# Patient Record
Sex: Female | Born: 1935 | Race: White | Hispanic: No | Smoking: Never smoker
Health system: Southern US, Community
[De-identification: ages and names within clinical notes are randomized; demographics above are authoritative.]

---

## 1997-12-24 ENCOUNTER — Other Ambulatory Visit: Admission: RE | Admit: 1997-12-24 | Discharge: 1997-12-24 | Payer: Self-pay | Admitting: Geriatric Medicine

## 1999-07-29 ENCOUNTER — Encounter: Admission: RE | Admit: 1999-07-29 | Discharge: 1999-07-29 | Payer: Self-pay | Admitting: Geriatric Medicine

## 1999-07-29 ENCOUNTER — Encounter: Payer: Self-pay | Admitting: Geriatric Medicine

## 2000-08-17 ENCOUNTER — Encounter: Admission: RE | Admit: 2000-08-17 | Discharge: 2000-08-17 | Payer: Self-pay | Admitting: Geriatric Medicine

## 2000-08-17 ENCOUNTER — Encounter: Payer: Self-pay | Admitting: Geriatric Medicine

## 2001-03-15 ENCOUNTER — Other Ambulatory Visit: Admission: RE | Admit: 2001-03-15 | Discharge: 2001-03-15 | Payer: Self-pay | Admitting: Geriatric Medicine

## 2001-08-18 ENCOUNTER — Encounter: Payer: Self-pay | Admitting: Geriatric Medicine

## 2001-08-18 ENCOUNTER — Encounter: Admission: RE | Admit: 2001-08-18 | Discharge: 2001-08-18 | Payer: Self-pay | Admitting: Geriatric Medicine

## 2002-04-23 ENCOUNTER — Ambulatory Visit (HOSPITAL_COMMUNITY): Admission: RE | Admit: 2002-04-23 | Discharge: 2002-04-23 | Payer: Self-pay | Admitting: *Deleted

## 2002-08-29 ENCOUNTER — Encounter: Payer: Self-pay | Admitting: Geriatric Medicine

## 2002-08-29 ENCOUNTER — Encounter: Admission: RE | Admit: 2002-08-29 | Discharge: 2002-08-29 | Payer: Self-pay | Admitting: Geriatric Medicine

## 2003-03-29 ENCOUNTER — Encounter: Admission: RE | Admit: 2003-03-29 | Discharge: 2003-03-29 | Payer: Self-pay | Admitting: Geriatric Medicine

## 2003-09-04 ENCOUNTER — Encounter: Admission: RE | Admit: 2003-09-04 | Discharge: 2003-09-04 | Payer: Self-pay | Admitting: Geriatric Medicine

## 2004-04-02 ENCOUNTER — Encounter: Admission: RE | Admit: 2004-04-02 | Discharge: 2004-04-02 | Payer: Self-pay | Admitting: Geriatric Medicine

## 2004-09-08 ENCOUNTER — Encounter: Admission: RE | Admit: 2004-09-08 | Discharge: 2004-09-08 | Payer: Self-pay | Admitting: Geriatric Medicine

## 2005-11-08 ENCOUNTER — Encounter: Admission: RE | Admit: 2005-11-08 | Discharge: 2005-11-08 | Payer: Self-pay | Admitting: Geriatric Medicine

## 2006-10-14 ENCOUNTER — Encounter: Admission: RE | Admit: 2006-10-14 | Discharge: 2006-10-14 | Payer: Self-pay | Admitting: Geriatric Medicine

## 2006-12-26 ENCOUNTER — Encounter: Admission: RE | Admit: 2006-12-26 | Discharge: 2006-12-26 | Payer: Self-pay | Admitting: Geriatric Medicine

## 2008-05-01 ENCOUNTER — Encounter: Admission: RE | Admit: 2008-05-01 | Discharge: 2008-05-01 | Payer: Self-pay | Admitting: Geriatric Medicine

## 2008-05-03 ENCOUNTER — Encounter: Admission: RE | Admit: 2008-05-03 | Discharge: 2008-05-03 | Payer: Self-pay | Admitting: Geriatric Medicine

## 2008-05-30 ENCOUNTER — Encounter: Admission: RE | Admit: 2008-05-30 | Discharge: 2008-05-30 | Payer: Self-pay | Admitting: Geriatric Medicine

## 2008-09-17 IMAGING — CR DG ANKLE COMPLETE 3+V*R*
3 series · 3 of 3 positions shown · non-contrast
Comparison: none

CLINICAL DATA: 88 year old with right ankle pain, injured 12/15/06. 
 RIGHT ANKLE THREE VIEWS:

[view not recorded (1 of 3)]
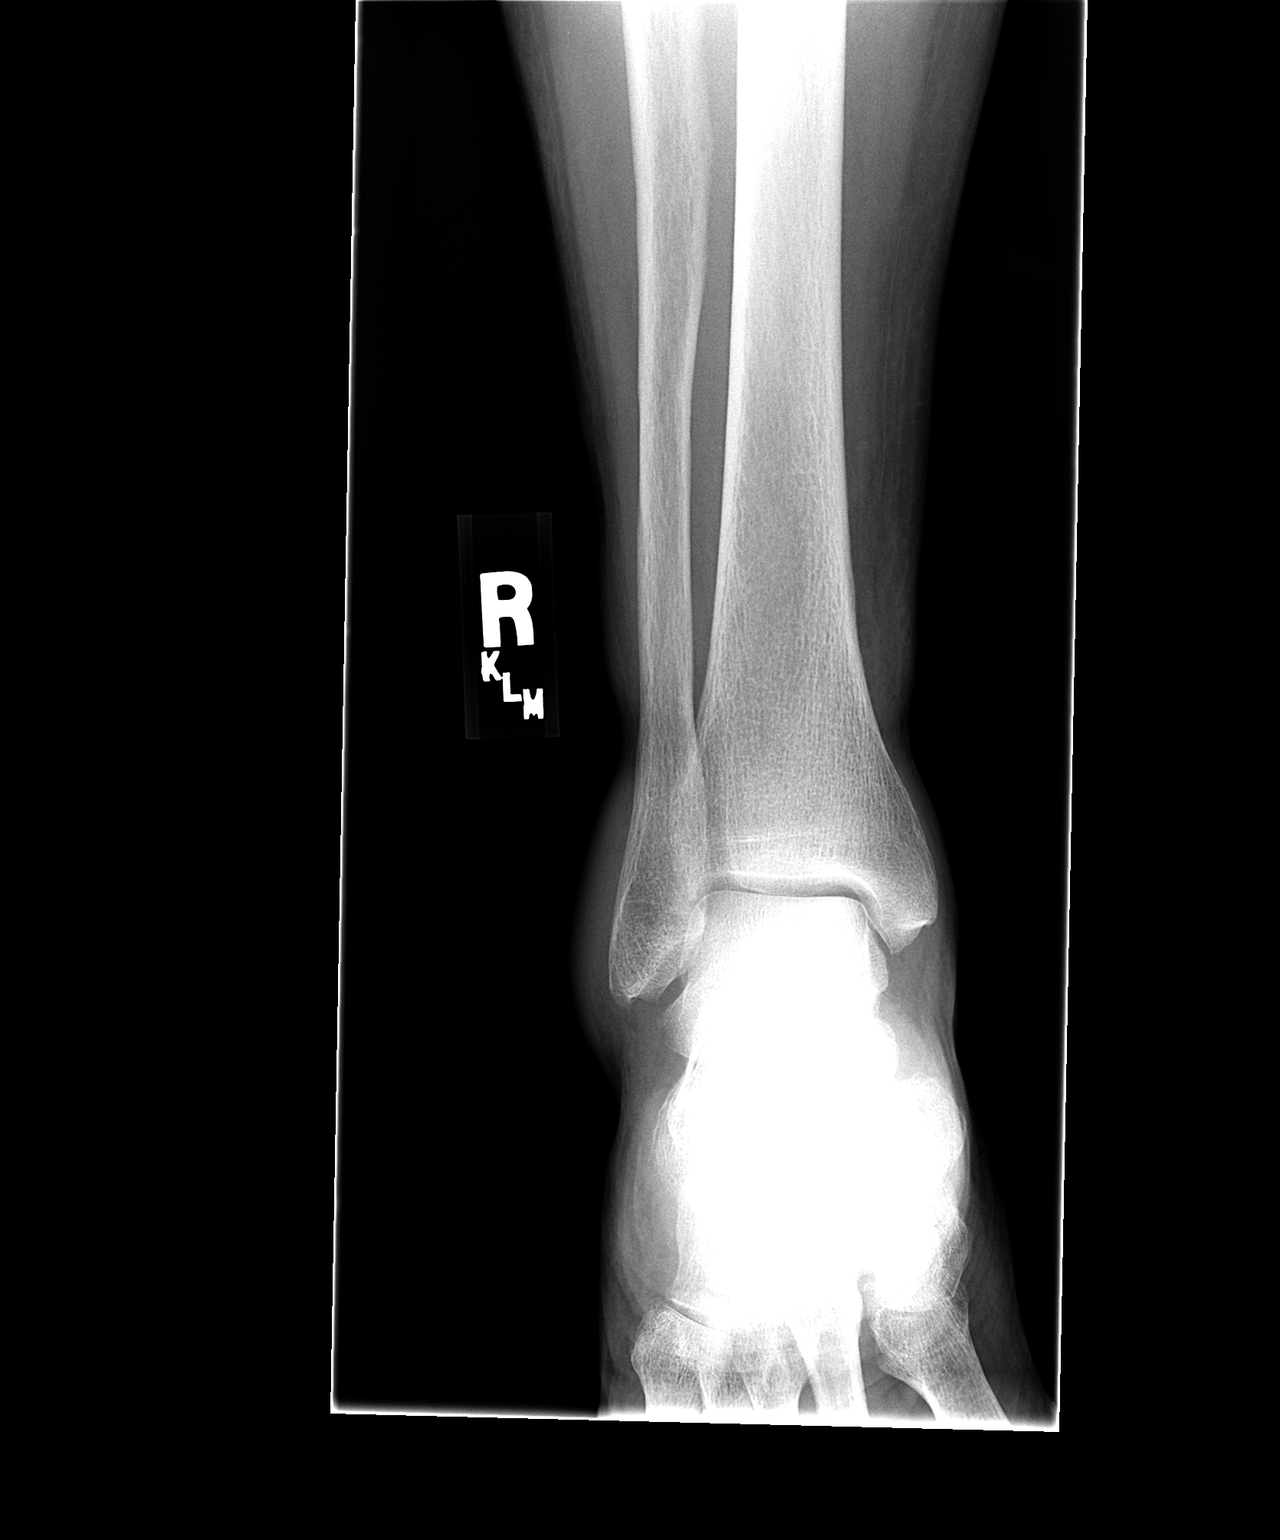

[view not recorded (2 of 3)]
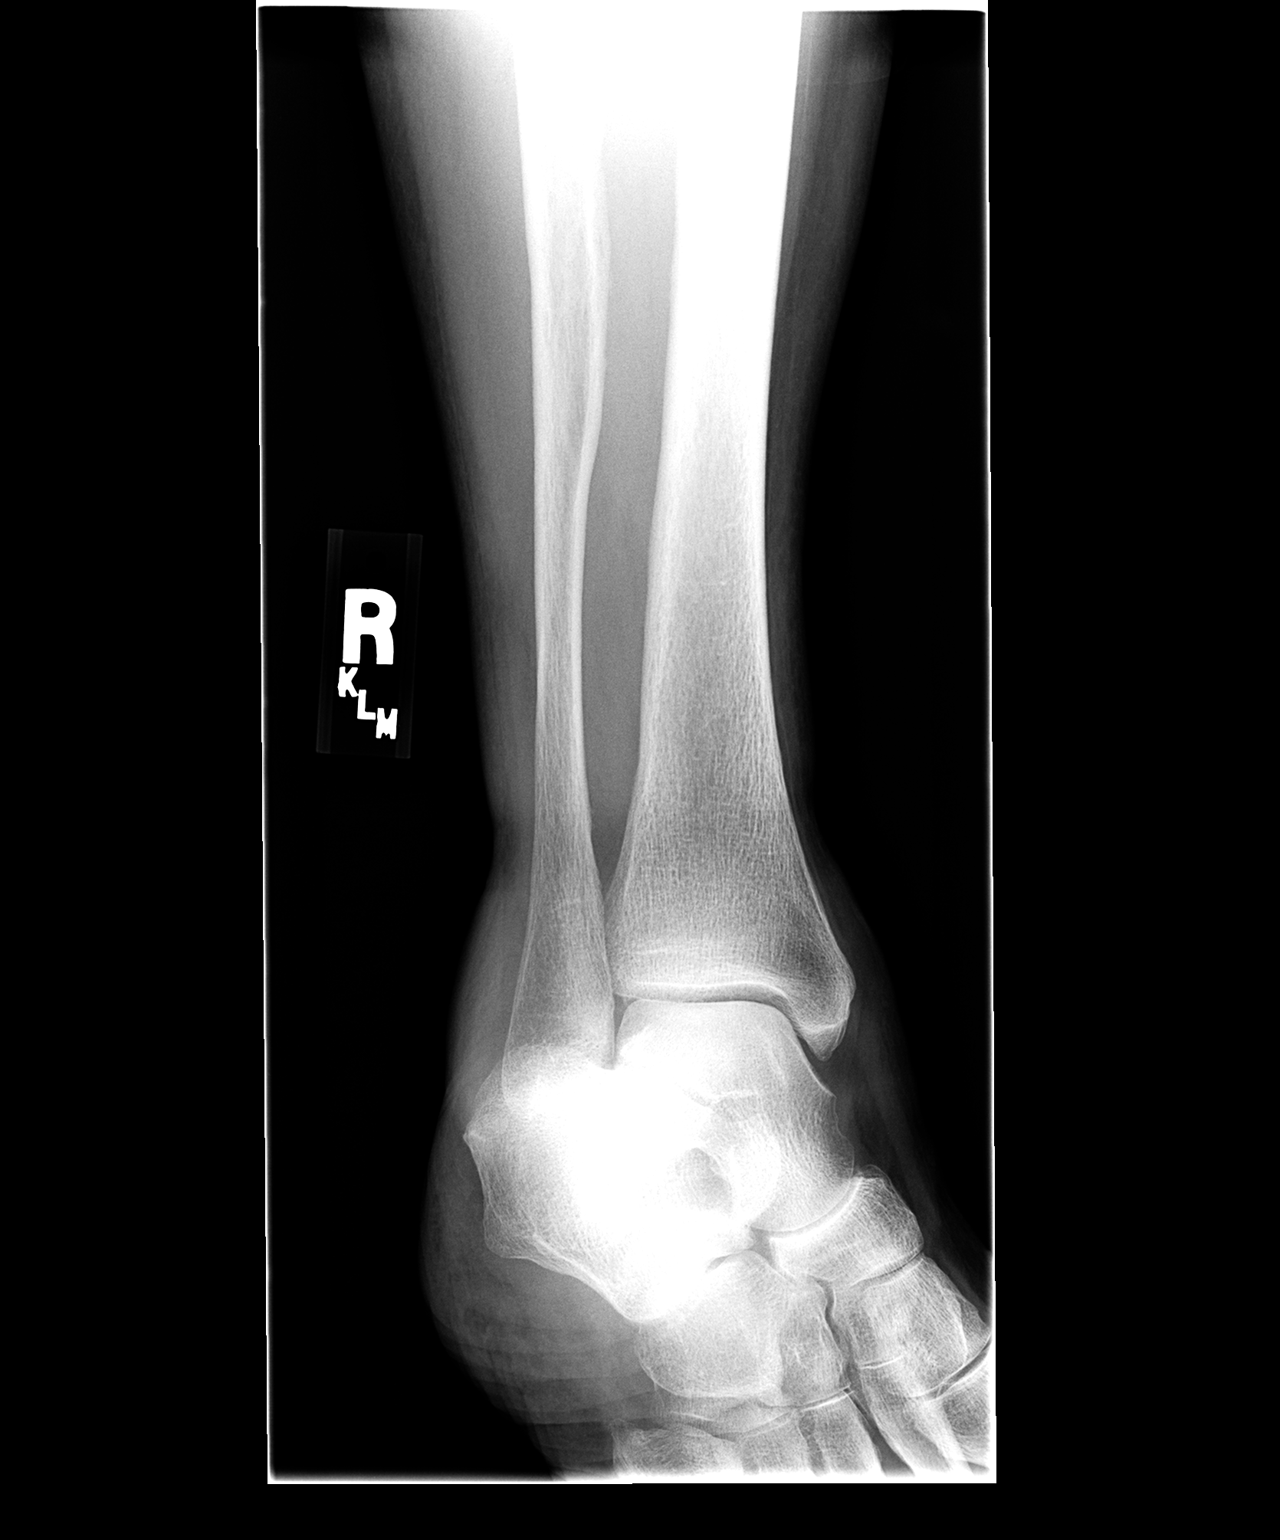

[view not recorded (3 of 3)]
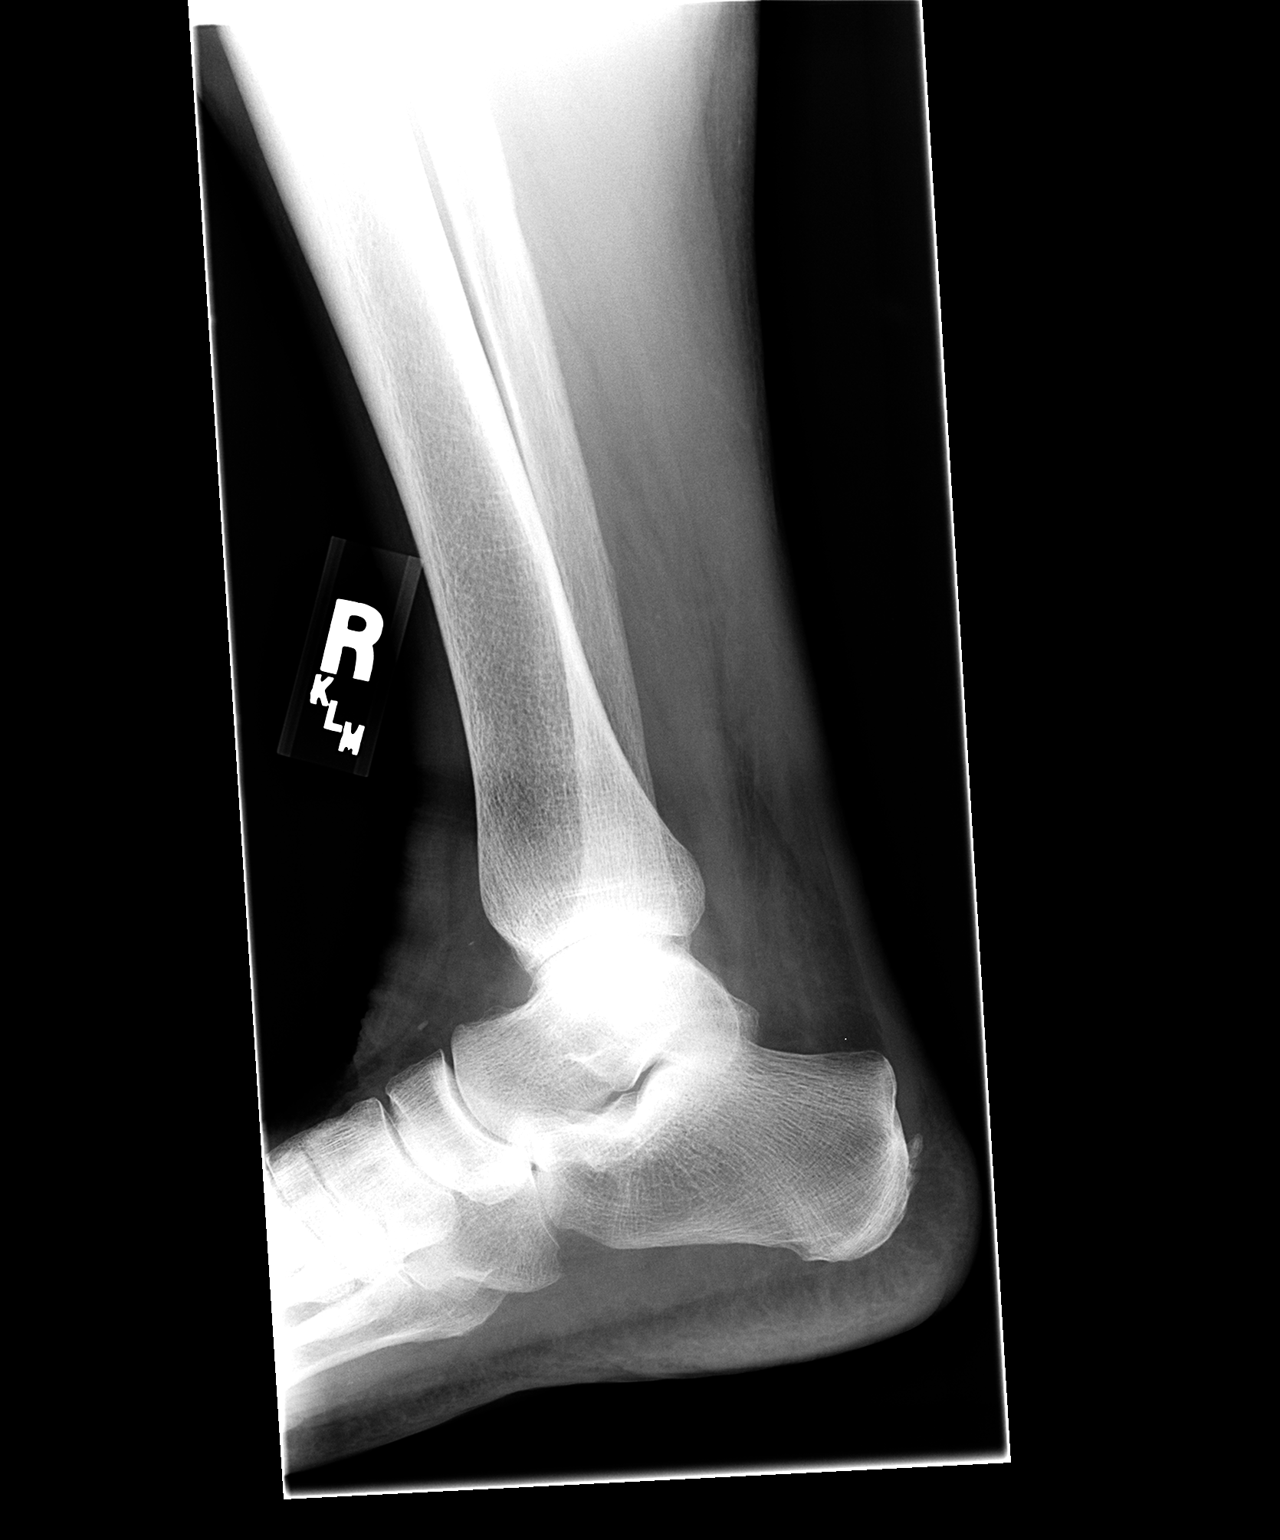

[3 of 3 positions shown; findings below may reference images not displayed]

FINDINGS: There is mild soft tissue swelling laterally.  The ankle mortis is maintained.  No fracture are seen.  No osteochondral lesions.
IMPRESSION: 1.  No acute bony findings or significant degenerative changes.

## 2008-12-27 ENCOUNTER — Encounter: Admission: RE | Admit: 2008-12-27 | Discharge: 2008-12-27 | Payer: Self-pay | Admitting: Urology

## 2009-06-06 ENCOUNTER — Encounter: Admission: RE | Admit: 2009-06-06 | Discharge: 2009-06-06 | Payer: Self-pay | Admitting: Geriatric Medicine

## 2010-01-22 IMAGING — CR DG CHEST 2V
2 series · 2 of 2 positions shown · non-contrast
Comparison: Chest x-ray of 10/14/2006

CLINICAL DATA: Exposed to tuberculosis as a child

CHEST - 2 VIEW

[view not recorded (1 of 2)]
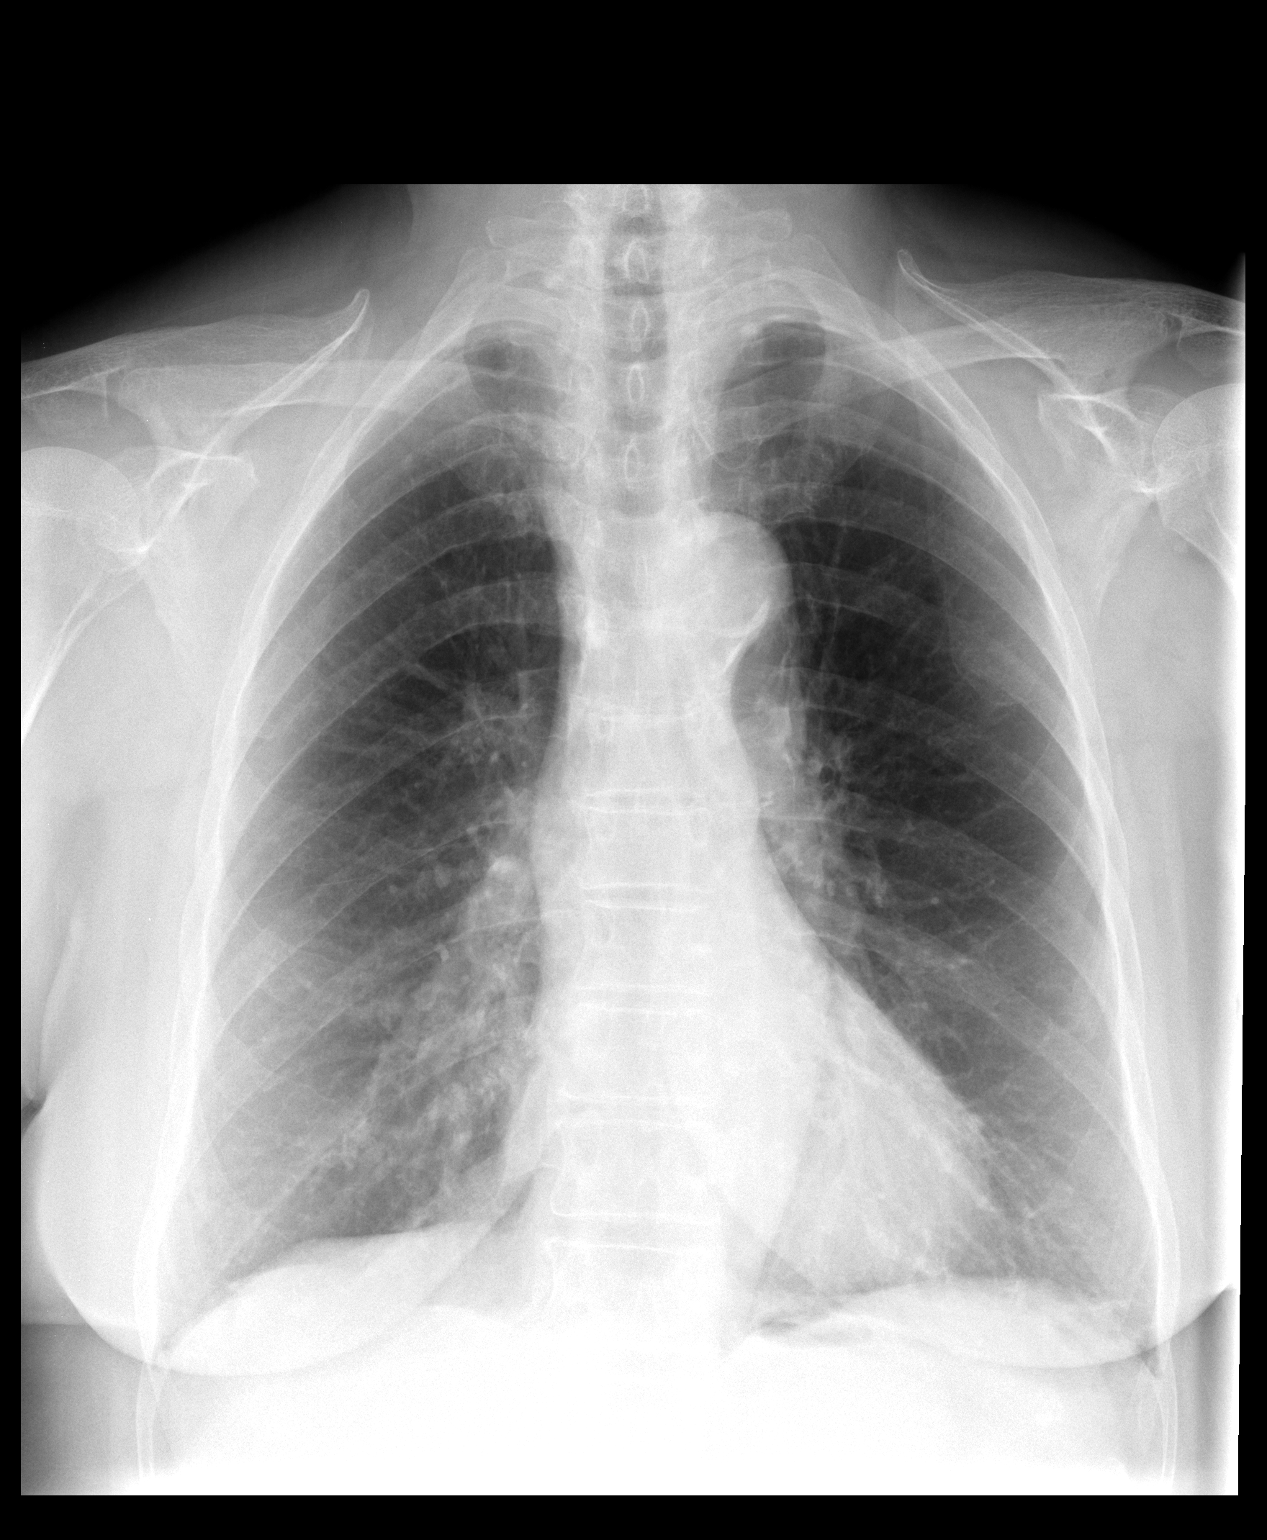

[view not recorded (2 of 2)]
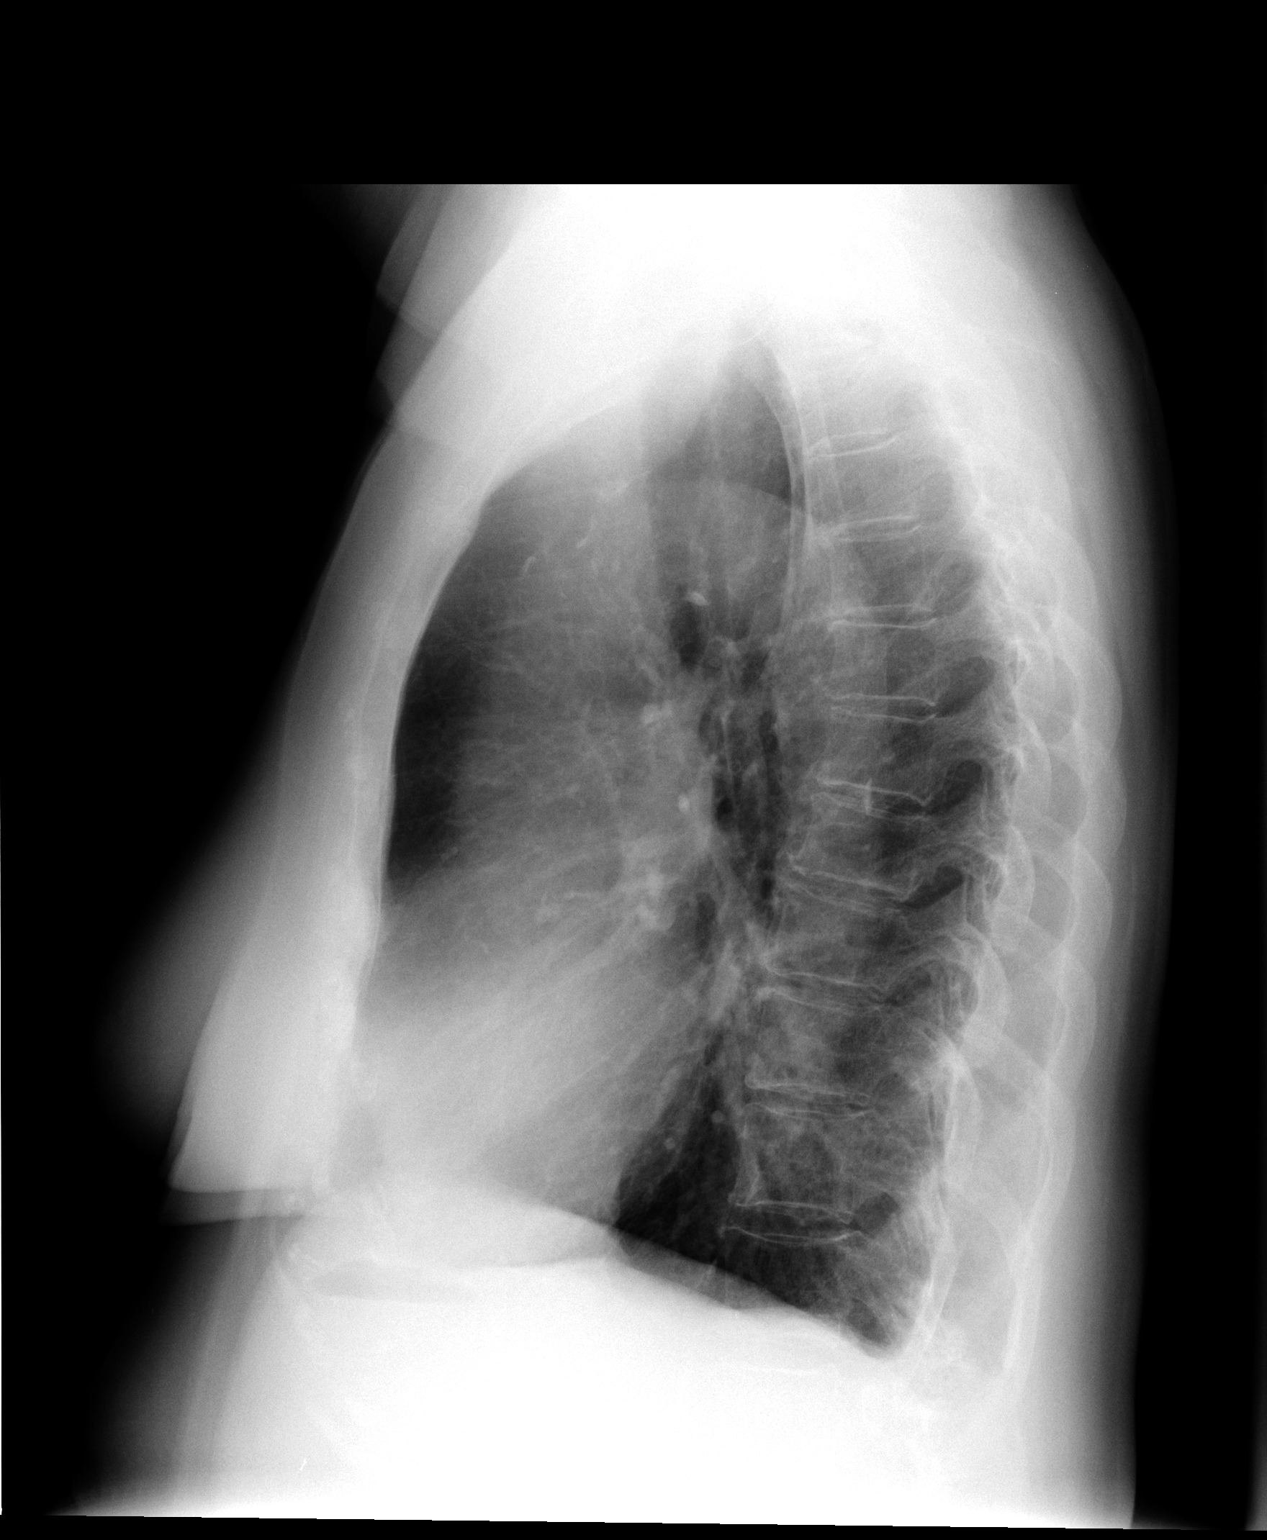

[2 of 2 positions shown; findings below may reference images not displayed]

FINDINGS: The lungs remain hyperaerated.  No active infiltrate or
effusion is seen.  The heart is within upper limits of normal.  No
acute bony abnormality is noted.
IMPRESSION: No active lung disease.  No change in hyperaeration.

## 2010-02-20 IMAGING — CR DG OS CALCIS 2+V*L*
2 series · 2 of 2 positions shown · non-contrast
Comparison: None

CLINICAL DATA: Heel pain.

LEFT OS CALCIS - 2+ VIEW

[view not recorded (1 of 2)]
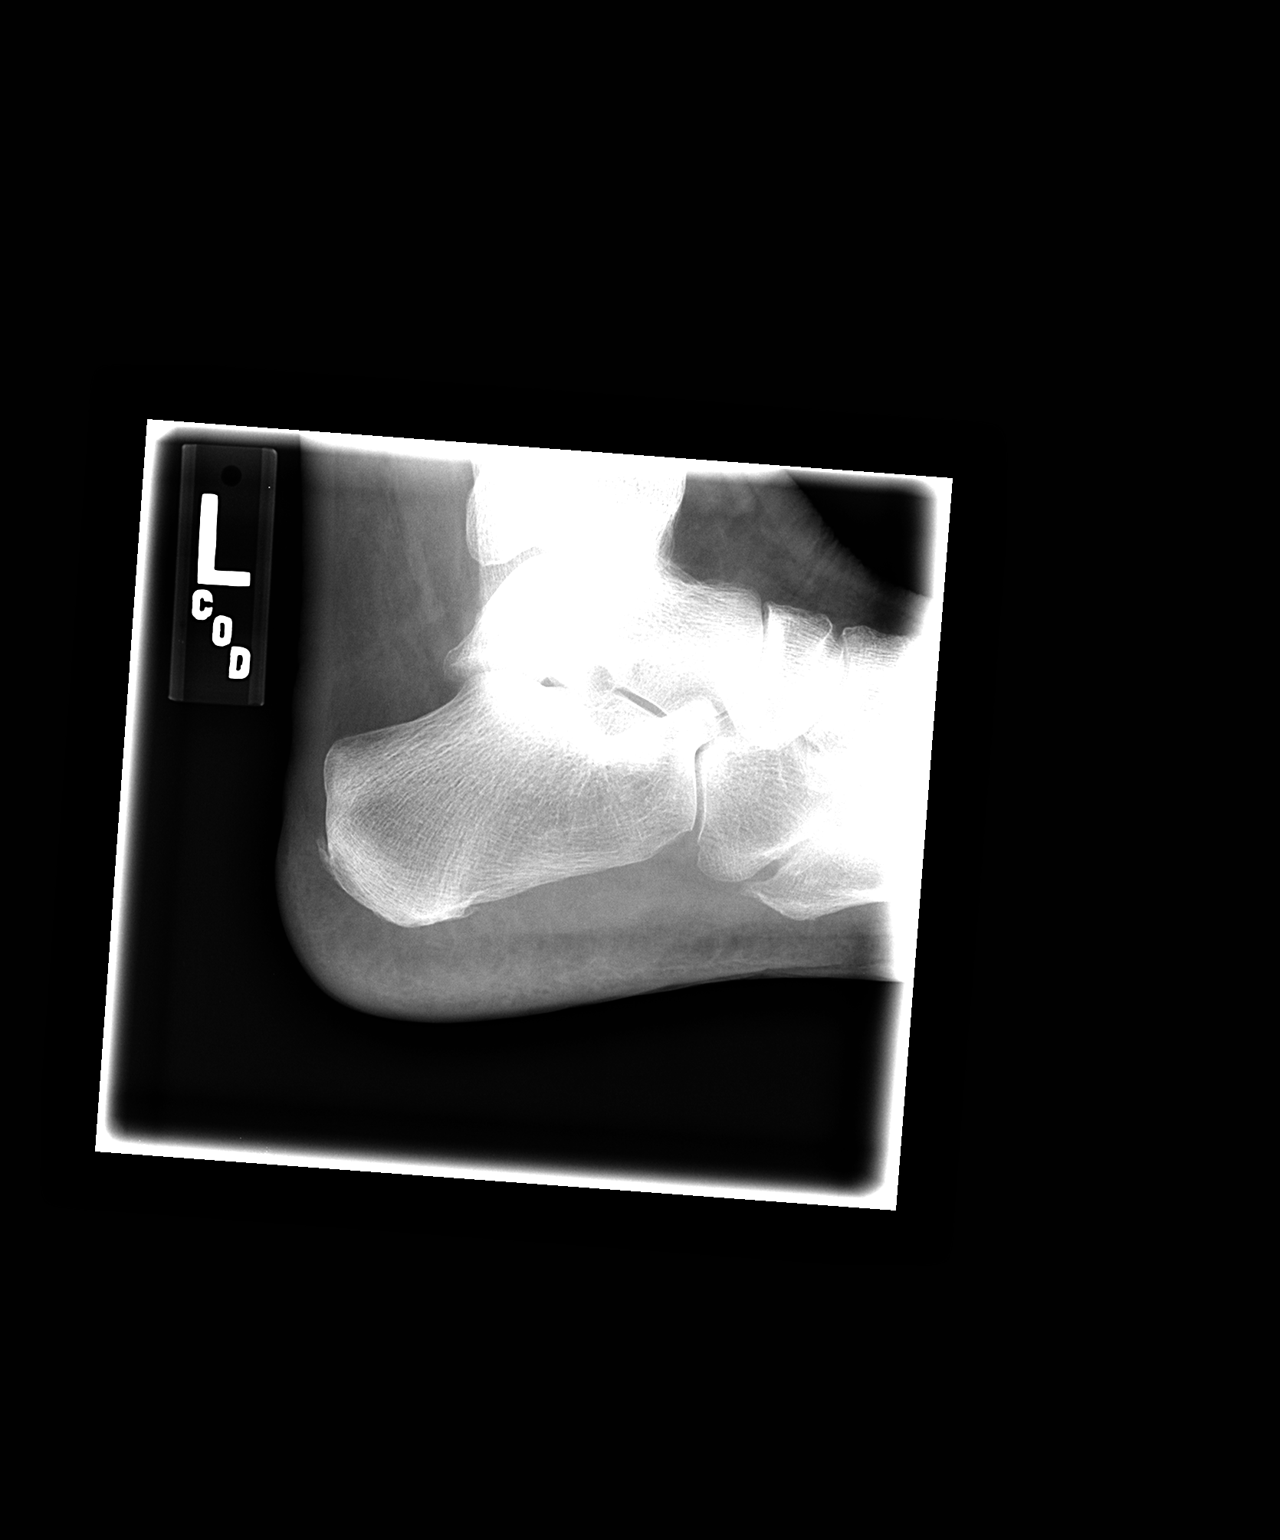

[view not recorded (2 of 2)]
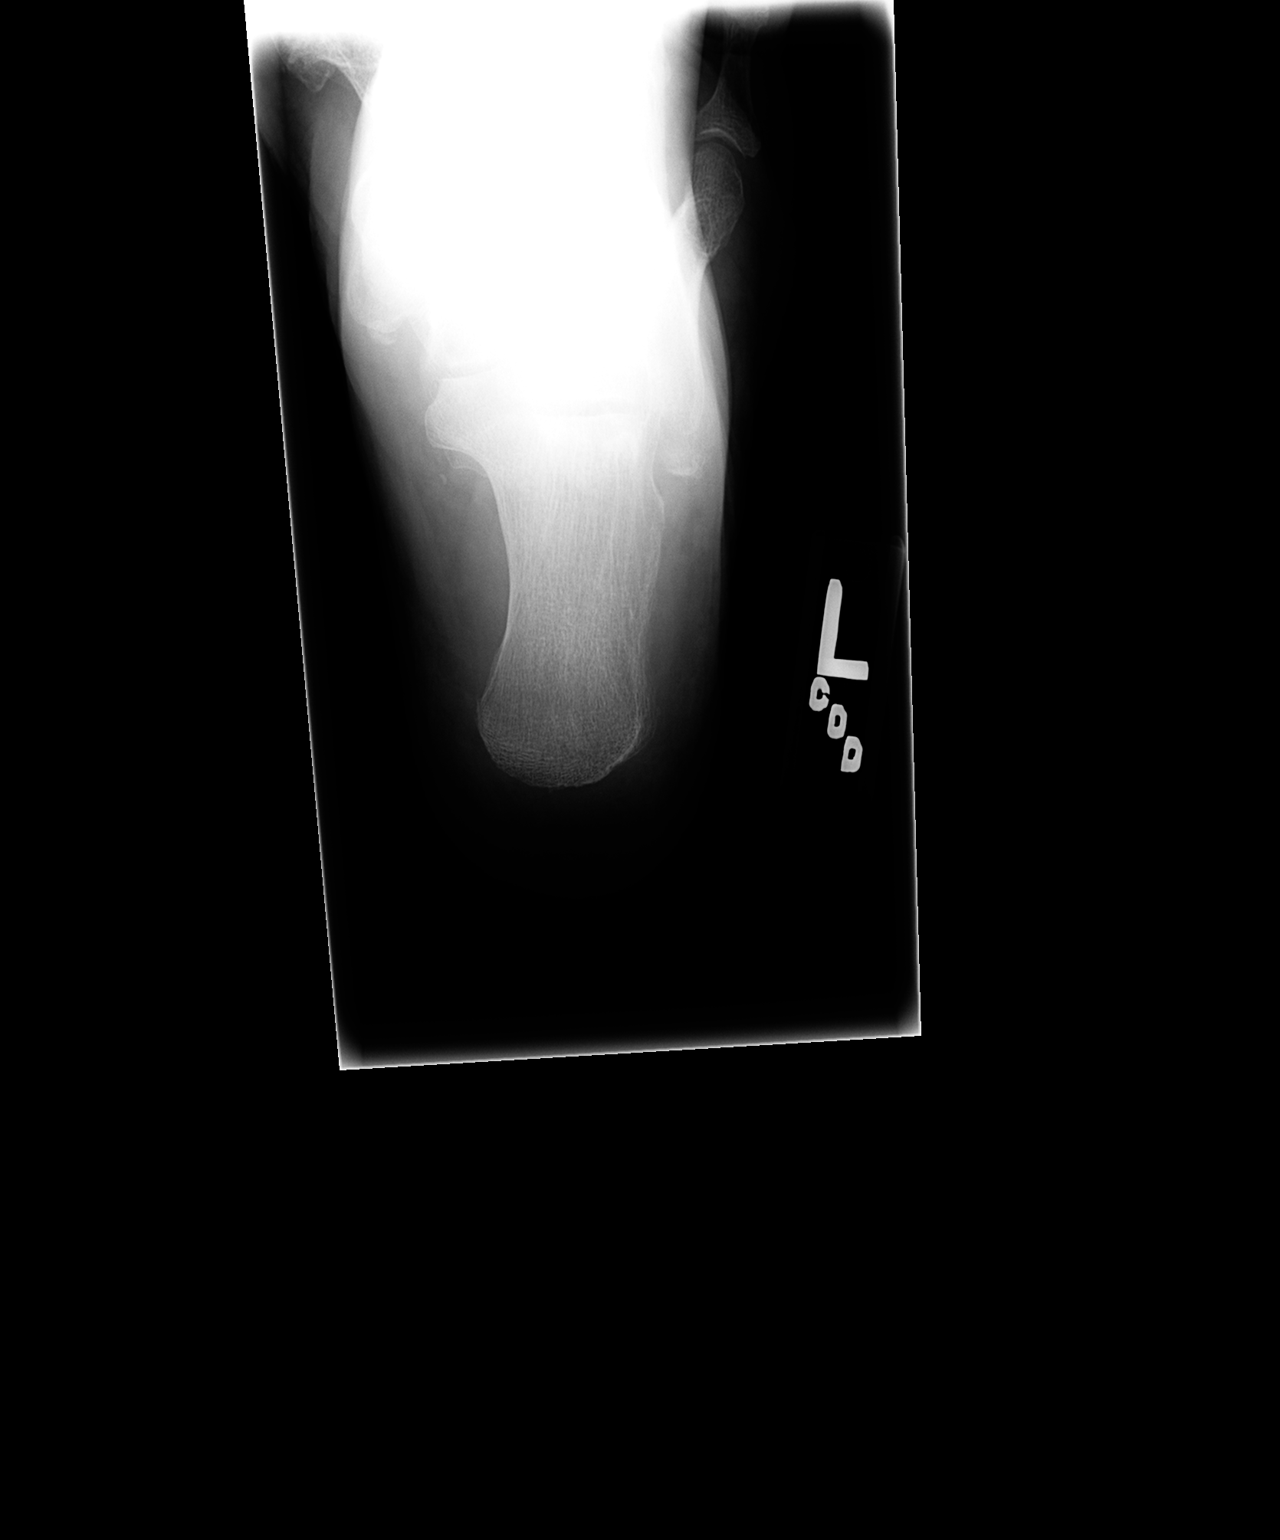

[2 of 2 positions shown; findings below may reference images not displayed]

FINDINGS: There is mild calcification of the distal Achilles tendon
at the insertion.  Negative for calcification of the plantar
fascia.

Small 1 x 2 mm calcification adjacent to the sustentaculum talus
may be some calcific tendonitis.

There is no fracture or arthropathy.
IMPRESSION: Mild calcification of the Achilles tendon insertion.

REF:G3 DICTATED: 05/30/2008 [DATE]

## 2010-05-10 ENCOUNTER — Encounter: Payer: Self-pay | Admitting: Geriatric Medicine

## 2013-03-13 ENCOUNTER — Ambulatory Visit (INDEPENDENT_AMBULATORY_CARE_PROVIDER_SITE_OTHER): Payer: Medicare Other

## 2013-03-13 VITALS — BP 147/64 | HR 62 | Resp 12 | Ht 64.0 in | Wt 167.0 lb

## 2013-03-13 DIAGNOSIS — M79609 Pain in unspecified limb: Secondary | ICD-10-CM

## 2013-03-13 DIAGNOSIS — E1140 Type 2 diabetes mellitus with diabetic neuropathy, unspecified: Secondary | ICD-10-CM

## 2013-03-13 DIAGNOSIS — B351 Tinea unguium: Secondary | ICD-10-CM

## 2013-03-13 DIAGNOSIS — E1149 Type 2 diabetes mellitus with other diabetic neurological complication: Secondary | ICD-10-CM

## 2013-03-13 DIAGNOSIS — Q828 Other specified congenital malformations of skin: Secondary | ICD-10-CM

## 2013-03-13 DIAGNOSIS — E1142 Type 2 diabetes mellitus with diabetic polyneuropathy: Secondary | ICD-10-CM

## 2013-03-13 NOTE — Patient Instructions (Signed)
Diabetes and Foot Care Diabetes may cause you to have problems because of poor blood supply (circulation) to your feet and legs. This may cause the skin on your feet to become thinner, break easier, and heal more slowly. Your skin may become dry, and the skin may peel and crack. You may also have nerve damage in your legs and feet causing decreased feeling in them. You may not notice minor injuries to your feet that could lead to infections or more serious problems. Taking care of your feet is one of the most important things you can do for yourself.  HOME CARE INSTRUCTIONS  Wear shoes at all times, even in the house. Do not go barefoot. Bare feet are easily injured.  Check your feet daily for blisters, cuts, and redness. If you cannot see the bottom of your feet, use a mirror or ask someone for help.  Wash your feet with warm water (do not use hot water) and mild soap. Then pat your feet and the areas between your toes until they are completely dry. Do not soak your feet as this can dry your skin.  Apply a moisturizing lotion or petroleum jelly (that does not contain alcohol and is unscented) to the skin on your feet and to dry, brittle toenails. Do not apply lotion between your toes.  Trim your toenails straight across. Do not dig under them or around the cuticle. File the edges of your nails with an emery board or nail file.  Do not cut corns or calluses or try to remove them with medicine.  Wear clean socks or stockings every day. Make sure they are not too tight. Do not wear knee-high stockings since they may decrease blood flow to your legs.  Wear shoes that fit properly and have enough cushioning. To break in new shoes, wear them for just a few hours a day. This prevents you from injuring your feet. Always look in your shoes before you put them on to be sure there are no objects inside.  Do not cross your legs. This may decrease the blood flow to your feet.  If you find a minor scrape,  cut, or break in the skin on your feet, keep it and the skin around it clean and dry. These areas may be cleansed with mild soap and water. Do not cleanse the area with peroxide, alcohol, or iodine.  When you remove an adhesive bandage, be sure not to damage the skin around it.  If you have a wound, look at it several times a day to make sure it is healing.  Do not use heating pads or hot water bottles. They may burn your skin. If you have lost feeling in your feet or legs, you may not know it is happening until it is too late.  Make sure your health care provider performs a complete foot exam at least annually or more often if you have foot problems. Report any cuts, sores, or bruises to your health care provider immediately. SEEK MEDICAL CARE IF:   You have an injury that is not healing.  You have cuts or breaks in the skin.  You have an ingrown nail.  You notice redness on your legs or feet.  You feel burning or tingling in your legs or feet.  You have pain or cramps in your legs and feet.  Your legs or feet are numb.  Your feet always feel cold. SEEK IMMEDIATE MEDICAL CARE IF:   There is increasing redness,   swelling, or pain in or around a wound.  There is a red line that goes up your leg.  Pus is coming from a wound.  You develop a fever or as directed by your health care provider.  You notice a bad smell coming from an ulcer or wound. Document Released: 04/02/2000 Document Revised: 12/06/2012 Document Reviewed: 09/12/2012 [employer] Patient Information 2014 [employer].  

## 2013-03-13 NOTE — Progress Notes (Signed)
  Subjective:    Patient ID: Kelsey Robbins, female    DOB: 01/14/1919, 79 y.o.   MRN: [id number]  HPI Comments: '' TOENAILS TRIM AND CHECK CALLUS RT FOOT''     Review of Systems  Eyes: Positive for visual disturbance.  All other systems reviewed and are negative.       Objective:   Physical Exam Neurovascular status is intact with pedal pulses palpable DP +2/4 for bilateral PT plus one over 4 bilateral and thready. Epicritic and proprioceptive sensations diminished on [facility] testing. There is keratoses pinch callus first MTP area right foot secondary to bunion deformity there is also keratoses lateral fifth digit left foot secondary to hammertoe and digital contracture . There is no open wounds or ulcerations current time. Nails thick discolored brittle incurvated and tender on palpation and debridement at this time. Orthopedic exam remarkable for notable bunion and HAV deformity as well as semirigid digital contractures with adductovarus rotation.       Assessment & Plan:  Assessment this time his diabetes with history peripheral neuropathy. A she also has thick brittle criptotic nails just with onychomycosis painful mycotic nails debrided 1 through 5 bilateral the presence of pain symptomology as well as her diabetes also debrided pinch callus of the right hallux return for future palliative care and as-needed basis suggest a 3 month followup  Alvan Dame DPM

## 2013-06-12 ENCOUNTER — Ambulatory Visit: Payer: Medicare Other

## 2013-07-10 ENCOUNTER — Ambulatory Visit (INDEPENDENT_AMBULATORY_CARE_PROVIDER_SITE_OTHER): Payer: Medicare Other

## 2013-07-10 VITALS — BP 146/66 | HR 67 | Resp 16

## 2013-07-10 DIAGNOSIS — B351 Tinea unguium: Secondary | ICD-10-CM

## 2013-07-10 DIAGNOSIS — M79609 Pain in unspecified limb: Secondary | ICD-10-CM

## 2013-07-10 DIAGNOSIS — E1140 Type 2 diabetes mellitus with diabetic neuropathy, unspecified: Secondary | ICD-10-CM

## 2013-07-10 NOTE — Patient Instructions (Signed)
Diabetes and Foot Care Diabetes may cause you to have problems because of poor blood supply (circulation) to your feet and legs. This may cause the skin on your feet to become thinner, break easier, and heal more slowly. Your skin may become dry, and the skin may peel and crack. You may also have nerve damage in your legs and feet causing decreased feeling in them. You may not notice minor injuries to your feet that could lead to infections or more serious problems. Taking care of your feet is one of the most important things you can do for yourself.  HOME CARE INSTRUCTIONS  Wear shoes at all times, even in the house. Do not go barefoot. Bare feet are easily injured.  Check your feet daily for blisters, cuts, and redness. If you cannot see the bottom of your feet, use a mirror or ask someone for help.  Wash your feet with warm water (do not use hot water) and mild soap. Then pat your feet and the areas between your toes until they are completely dry. Do not soak your feet as this can dry your skin.  Apply a moisturizing lotion or petroleum jelly (that does not contain alcohol and is unscented) to the skin on your feet and to dry, brittle toenails. Do not apply lotion between your toes.  Trim your toenails straight across. Do not dig under them or around the cuticle. File the edges of your nails with an emery board or nail file.  Do not cut corns or calluses or try to remove them with medicine.  Wear clean socks or stockings every day. Make sure they are not too tight. Do not wear knee-high stockings since they may decrease blood flow to your legs.  Wear shoes that fit properly and have enough cushioning. To break in new shoes, wear them for just a few hours a day. This prevents you from injuring your feet. Always look in your shoes before you put them on to be sure there are no objects inside.  Do not cross your legs. This may decrease the blood flow to your feet.  If you find a minor scrape,  cut, or break in the skin on your feet, keep it and the skin around it clean and dry. These areas may be cleansed with mild soap and water. Do not cleanse the area with peroxide, alcohol, or iodine.  When you remove an adhesive bandage, be sure not to damage the skin around it.  If you have a wound, look at it several times a day to make sure it is healing.  Do not use heating pads or hot water bottles. They may burn your skin. If you have lost feeling in your feet or legs, you may not know it is happening until it is too late.  Make sure your health care provider performs a complete foot exam at least annually or more often if you have foot problems. Report any cuts, sores, or bruises to your health care provider immediately. SEEK MEDICAL CARE IF:   You have an injury that is not healing.  You have cuts or breaks in the skin.  You have an ingrown nail.  You notice redness on your legs or feet.  You feel burning or tingling in your legs or feet.  You have pain or cramps in your legs and feet.  Your legs or feet are numb.  Your feet always feel cold. SEEK IMMEDIATE MEDICAL CARE IF:   There is increasing redness,   swelling, or pain in or around a wound.  There is a red line that goes up your leg.  Pus is coming from a wound.  You develop a fever or as directed by your health care provider.  You notice a bad smell coming from an ulcer or wound. Document Released: 04/02/2000 Document Revised: 12/06/2012 Document Reviewed: 09/12/2012 [employer] Patient Information 2014 [employer].  

## 2013-07-10 NOTE — Progress Notes (Signed)
   Subjective:    Patient ID: Kelsey Robbins, female    DOB: 06/27/1918, 80 y.o.   MRN: [id number]  HPI Comments: "I need my toenails cut and there is a little callus on the right foot"     Review of Systems no new changes or findings     Objective:   Physical Exam Neurovascular status intact and unchanged pedal pulses palpable bilateral epicritic and proprioceptive sensations diminished on [facility] testing to the digits and plantar arch the hallux pinch callus of the right has resolved there is no notable at this time however nails thick brittle crumbly discolored friable incurvated 1 through 5 bilateral. Patient has no other wounds no open ulcers or other difficulties at this time.       Assessment & Plan:  Assessment diabetes with peripheral neuropathy also onychomycosis painful mycotic dystrophic brittle nails debrided 1 through 5 bilateral at this time Richard for future diabetic foot and palliative nail care on an as-needed basis suggest 3 month followup  Alvan Dameichard Sikora DPM

## 2013-10-02 ENCOUNTER — Ambulatory Visit (INDEPENDENT_AMBULATORY_CARE_PROVIDER_SITE_OTHER): Payer: Medicare Other

## 2013-10-02 VITALS — BP 174/73 | HR 56 | Resp 14 | Ht 64.5 in | Wt 167.0 lb

## 2013-10-02 DIAGNOSIS — E1149 Type 2 diabetes mellitus with other diabetic neurological complication: Secondary | ICD-10-CM

## 2013-10-02 DIAGNOSIS — Q828 Other specified congenital malformations of skin: Secondary | ICD-10-CM

## 2013-10-02 DIAGNOSIS — E1142 Type 2 diabetes mellitus with diabetic polyneuropathy: Secondary | ICD-10-CM

## 2013-10-02 DIAGNOSIS — E1140 Type 2 diabetes mellitus with diabetic neuropathy, unspecified: Secondary | ICD-10-CM

## 2013-10-02 DIAGNOSIS — M79609 Pain in unspecified limb: Secondary | ICD-10-CM

## 2013-10-02 DIAGNOSIS — B351 Tinea unguium: Secondary | ICD-10-CM

## 2013-10-02 NOTE — Patient Instructions (Signed)
Diabetes and Foot Care Diabetes may cause you to have problems because of poor blood supply (circulation) to your feet and legs. This may cause the skin on your feet to become thinner, break easier, and heal more slowly. Your skin may become dry, and the skin may peel and crack. You may also have nerve damage in your legs and feet causing decreased feeling in them. You may not notice minor injuries to your feet that could lead to infections or more serious problems. Taking care of your feet is one of the most important things you can do for yourself.  HOME CARE INSTRUCTIONS  Wear shoes at all times, even in the house. Do not go barefoot. Bare feet are easily injured.  Check your feet daily for blisters, cuts, and redness. If you cannot see the bottom of your feet, use a mirror or ask someone for help.  Wash your feet with warm water (do not use hot water) and mild soap. Then pat your feet and the areas between your toes until they are completely dry. Do not soak your feet as this can dry your skin.  Apply a moisturizing lotion or petroleum jelly (that does not contain alcohol and is unscented) to the skin on your feet and to dry, brittle toenails. Do not apply lotion between your toes.  Trim your toenails straight across. Do not dig under them or around the cuticle. File the edges of your nails with an emery board or nail file.  Do not cut corns or calluses or try to remove them with medicine.  Wear clean socks or stockings every day. Make sure they are not too tight. Do not wear knee-high stockings since they may decrease blood flow to your legs.  Wear shoes that fit properly and have enough cushioning. To break in new shoes, wear them for just a few hours a day. This prevents you from injuring your feet. Always look in your shoes before you put them on to be sure there are no objects inside.  Do not cross your legs. This may decrease the blood flow to your feet.  If you find a minor scrape,  cut, or break in the skin on your feet, keep it and the skin around it clean and dry. These areas may be cleansed with mild soap and water. Do not cleanse the area with peroxide, alcohol, or iodine.  When you remove an adhesive bandage, be sure not to damage the skin around it.  If you have a wound, look at it several times a day to make sure it is healing.  Do not use heating pads or hot water bottles. They may burn your skin. If you have lost feeling in your feet or legs, you may not know it is happening until it is too late.  Make sure your health care provider performs a complete foot exam at least annually or more often if you have foot problems. Report any cuts, sores, or bruises to your health care provider immediately. SEEK MEDICAL CARE IF:   You have an injury that is not healing.  You have cuts or breaks in the skin.  You have an ingrown nail.  You notice redness on your legs or feet.  You feel burning or tingling in your legs or feet.  You have pain or cramps in your legs and feet.  Your legs or feet are numb.  Your feet always feel cold. SEEK IMMEDIATE MEDICAL CARE IF:   There is increasing redness,   swelling, or pain in or around a wound.  There is a red line that goes up your leg.  Pus is coming from a wound.  You develop a fever or as directed by your health care provider.  You notice a bad smell coming from an ulcer or wound. Document Released: 04/02/2000 Document Revised: 12/06/2012 Document Reviewed: 09/12/2012 [employer] Patient Information 2014 [employer].  

## 2013-10-02 NOTE — Progress Notes (Signed)
   Subjective:    Patient ID: Kelsey EatonBeatrice R Robbins, female    DOB: 08/01/1918, 80 y.o.   MRN: [id number]  HPI Comments: Pt presents for debridement of 10 toenails, and corns and callouses.     Review of Systems no new findings or systemic changes noted     Objective:   Physical Exam Neurovascular status is unchanged pedal pulses are palpable DP and PT plus one over 4 bilateral capillary refill time 3 seconds all digits epicritic sensation diminished to the toes and plantar forefoot and arch. There is pinch callus the hallux and the right was HD 5 right nails thick brittle crumbly friable yellow discolored 1 through 5 bilateral no open wounds ulcerations no secondary infection is noted neurovascular status strength is otherwise normal patient still ambulating comfortably wearing appropriate accommodative shoe       Assessment & Plan:  Assessment this time his diabetes with peripheral neuropathy as well as onychomycosis painful mycotic dystrophic nails 1 through 5 bilateral debrided this time still keratoses pinch callus the hallux as well as the HD 5 right is debrided procedure 3 month followup for continued palliative care in the future  Alvan Dameichard Sikora DPM

## 2013-10-16 ENCOUNTER — Ambulatory Visit: Payer: Medicare Other

## 2014-01-08 ENCOUNTER — Ambulatory Visit (INDEPENDENT_AMBULATORY_CARE_PROVIDER_SITE_OTHER): Payer: Medicare Other

## 2014-01-08 VITALS — BP 144/86 | HR 86 | Resp 12

## 2014-01-08 DIAGNOSIS — M79609 Pain in unspecified limb: Secondary | ICD-10-CM

## 2014-01-08 DIAGNOSIS — B351 Tinea unguium: Secondary | ICD-10-CM

## 2014-01-08 DIAGNOSIS — Q828 Other specified congenital malformations of skin: Secondary | ICD-10-CM

## 2014-01-08 DIAGNOSIS — M79676 Pain in unspecified toe(s): Secondary | ICD-10-CM

## 2014-01-08 DIAGNOSIS — E1149 Type 2 diabetes mellitus with other diabetic neurological complication: Secondary | ICD-10-CM

## 2014-01-08 DIAGNOSIS — E1142 Type 2 diabetes mellitus with diabetic polyneuropathy: Secondary | ICD-10-CM

## 2014-01-08 DIAGNOSIS — E1140 Type 2 diabetes mellitus with diabetic neuropathy, unspecified: Secondary | ICD-10-CM

## 2014-01-08 NOTE — Progress Notes (Signed)
   Subjective:    Patient ID: Kelsey Robbins, female    DOB: 1918/09/02, 80 y.o.   MRN: [id number]  HPI TOENAILS TRIM AND CHECK DIABETIC FOOT.   Review of Systems no new findings or systemic changes noted     Objective:   Physical Exam Vascular status is intact DP and PT plus one over 4 bilateral capillary refill time 3 seconds. Epicritic sensations diminished to forefoot and arch and digits there is keratoses pinch callus of the hallux right and as well as HD 5 right. No open wounds or ulcers no secondary infections digital contractures hammertoe deformities are identified.       Assessment & Plan:  Assessment diabetes with history peripheral neuropathy and complications mycotic nails thick brittle criptotic incurvated nails 1 through 5 bilateral tender both on palpation with enclosed shoe wear and ambulation painful mycotic nails debrided x10 also keratoses HD 5 right and pinch callus first right debridement lumicain and Neosporin applied to the callus first right following debridement maintain Band-Aid for leaks 24 hours recheck in 3 months for long-term followup patient is advised she can use nonmedicated corn pads for her toe she misunderstood that she can or any pads cushions. I advised that she does need to avoid medicated corn pads or red or corn pads or foam sleeves are acceptable. Recheck in 3 months for palliative care  Alvan Dame DPM

## 2014-01-08 NOTE — Patient Instructions (Signed)
Diabetes and Foot Care Diabetes may cause you to have problems because of poor blood supply (circulation) to your feet and legs. This may cause the skin on your feet to become thinner, break easier, and heal more slowly. Your skin may become dry, and the skin may peel and crack. You may also have nerve damage in your legs and feet causing decreased feeling in them. You may not notice minor injuries to your feet that could lead to infections or more serious problems. Taking care of your feet is one of the most important things you can do for yourself.  HOME CARE INSTRUCTIONS  Wear shoes at all times, even in the house. Do not go barefoot. Bare feet are easily injured.  Check your feet daily for blisters, cuts, and redness. If you cannot see the bottom of your feet, use a mirror or ask someone for help.  Wash your feet with warm water (do not use hot water) and mild soap. Then pat your feet and the areas between your toes until they are completely dry. Do not soak your feet as this can dry your skin.  Apply a moisturizing lotion or petroleum jelly (that does not contain alcohol and is unscented) to the skin on your feet and to dry, brittle toenails. Do not apply lotion between your toes.  Trim your toenails straight across. Do not dig under them or around the cuticle. File the edges of your nails with an emery board or nail file.  Do not cut corns or calluses or try to remove them with medicine.  Wear clean socks or stockings every day. Make sure they are not too tight. Do not wear knee-high stockings since they may decrease blood flow to your legs.  Wear shoes that fit properly and have enough cushioning. To break in new shoes, wear them for just a few hours a day. This prevents you from injuring your feet. Always look in your shoes before you put them on to be sure there are no objects inside.  Do not cross your legs. This may decrease the blood flow to your feet.  If you find a minor scrape,  cut, or break in the skin on your feet, keep it and the skin around it clean and dry. These areas may be cleansed with mild soap and water. Do not cleanse the area with peroxide, alcohol, or iodine.  When you remove an adhesive bandage, be sure not to damage the skin around it.  If you have a wound, look at it several times a day to make sure it is healing.  Do not use heating pads or hot water bottles. They may burn your skin. If you have lost feeling in your feet or legs, you may not know it is happening until it is too late.  Make sure your health care provider performs a complete foot exam at least annually or more often if you have foot problems. Report any cuts, sores, or bruises to your health care provider immediately. SEEK MEDICAL CARE IF:   You have an injury that is not healing.  You have cuts or breaks in the skin.  You have an ingrown nail.  You notice redness on your legs or feet.  You feel burning or tingling in your legs or feet.  You have pain or cramps in your legs and feet.  Your legs or feet are numb.  Your feet always feel cold. SEEK IMMEDIATE MEDICAL CARE IF:   There is increasing redness,   swelling, or pain in or around a wound.  There is a red line that goes up your leg.  Pus is coming from a wound.  You develop a fever or as directed by your health care provider.  You notice a bad smell coming from an ulcer or wound. Document Released: 04/02/2000 Document Revised: 12/06/2012 Document Reviewed: 09/12/2012 [employer] Patient Information 2015 [employer]. This information is not intended to replace advice given to you by your health care provider. Make sure you discuss any questions you have with your health care provider.  

## 2014-04-09 ENCOUNTER — Ambulatory Visit (INDEPENDENT_AMBULATORY_CARE_PROVIDER_SITE_OTHER): Payer: Medicare Other

## 2014-04-09 DIAGNOSIS — B351 Tinea unguium: Secondary | ICD-10-CM

## 2014-04-09 DIAGNOSIS — M79673 Pain in unspecified foot: Secondary | ICD-10-CM

## 2014-04-09 DIAGNOSIS — E1140 Type 2 diabetes mellitus with diabetic neuropathy, unspecified: Secondary | ICD-10-CM

## 2014-04-09 NOTE — Progress Notes (Signed)
   Subjective:    Patient ID: Kelsey EatonBeatrice R Robbins, female    DOB: 04/15/1919, 80 y.o.   MRN: [id number]  HPI  Pt presents for nail debridement  Review of Systems no new findings or systemic changes noted     Objective:   Physical Exam vascular status is intact DP and PT one over 4 bilateral Refill time 3 seconds keratoses HD 5 right thick brittle crumbly dystrophic friable criptotic nails 1 through 5 bilateral. No other new changes no open wounds or ulcers noted      Assessment & Plan:  Assessment this time his diabetes with history peripheral neuropathy dystrophic frontal mycotic nails debrided 1 through 5 bilateral keratoses HD 5 right debrided treated with limited cane and Neosporin and to foam padding dispensed to cushion the toe. Reappointed in 3 months for an as-needed basis for future palliative care is needed  Kelsey Robbins DPM

## 2014-07-16 ENCOUNTER — Ambulatory Visit: Payer: Medicare Other

## 2014-07-23 ENCOUNTER — Ambulatory Visit (INDEPENDENT_AMBULATORY_CARE_PROVIDER_SITE_OTHER): Payer: Medicare Other | Admitting: Podiatry

## 2014-07-23 DIAGNOSIS — B351 Tinea unguium: Secondary | ICD-10-CM | POA: Diagnosis not present

## 2014-07-23 DIAGNOSIS — E1140 Type 2 diabetes mellitus with diabetic neuropathy, unspecified: Secondary | ICD-10-CM | POA: Diagnosis not present

## 2014-07-23 DIAGNOSIS — M79673 Pain in unspecified foot: Secondary | ICD-10-CM | POA: Diagnosis not present

## 2014-07-23 NOTE — Progress Notes (Signed)
   Subjective:    Patient ID: Kelsey Robbins, female    DOB: 01/30/1919, 81 y.o.   MRN: [id number]  HPI  Pt presents for nail debridement  Review of Systems no new findings or systemic changes noted     Objective:   Physical Exam vascular status is intact DP and PT one over 4 bilateral Refill time 3 seconds keratoses HD 5 right thick brittle crumbly dystrophic friable criptotic nails 1 through 5 bilateral. No other new changes no open wounds or ulcers noted      Assessment & Plan:  Assessment this time his diabetes with history peripheral neuropathy dystrophic frontal mycotic nails debrided 1 through 5 bilateral keratoses HD 5 right debrided treated with limited cane and Neosporin and to foam padding dispensed to cushion the toe. Reappointed in 3 months for an as-needed basis for future palliative care is needed

## 2014-10-22 ENCOUNTER — Ambulatory Visit: Payer: Medicare Other

## 2014-10-31 ENCOUNTER — Ambulatory Visit (INDEPENDENT_AMBULATORY_CARE_PROVIDER_SITE_OTHER): Payer: Medicare Other | Admitting: Podiatry

## 2014-10-31 ENCOUNTER — Encounter: Payer: Self-pay | Admitting: Podiatry

## 2014-10-31 VITALS — BP 124/55 | HR 67 | Resp 18

## 2014-10-31 DIAGNOSIS — B351 Tinea unguium: Secondary | ICD-10-CM | POA: Diagnosis not present

## 2014-10-31 DIAGNOSIS — M79676 Pain in unspecified toe(s): Secondary | ICD-10-CM

## 2014-10-31 DIAGNOSIS — E1140 Type 2 diabetes mellitus with diabetic neuropathy, unspecified: Secondary | ICD-10-CM

## 2014-10-31 MED ORDER — CEPHALEXIN 500 MG PO CAPS: 500.0000 mg | ORAL_CAPSULE | Freq: Two times a day (BID) | ORAL | Status: DC

## 2014-11-01 NOTE — Progress Notes (Signed)
Patient ID: Rebecca EatonBeatrice R Rider, female   DOB: 02/17/1919, 81 y.o.   MRN: [id number]  Complaint:  Visit Type: Patient returns to my office for continued preventative foot care services. Complaint: Patient states" my nails have grown long and thick and become painful to walk and wear shoes" Patient has been diagnosed with DM with no foot  complications. He presents for preventative foot care services. No changes to ROS  Podiatric Exam: Vascular: dorsalis pedis and posterior tibial pulses are palpable bilateral. Capillary return is immediate. Temperature gradient is WNL. Skin turgor WNL  Sensorium: Diminished l [facility] monofilament test. Normal tactile sensation bilaterally. Nail Exam: Pt has thick disfigured discolored nails with subungual debris noted bilateral entire nail hallux through fifth toenails Ulcer Exam: There is no evidence of ulcer or pre-ulcerative changes or infection. Orthopedic Exam: Muscle tone and strength are WNL. No limitations in general ROM. No crepitus or effusions noted. Foot type and digits show no abnormalities. Bony prominences are unremarkable. Skin: No Porokeratosis. No infection or ulcers  Diagnosis:  Tinea unguium, Pain in right toe, pain in left toes  Treatment & Plan Procedures and Treatment: Consent by patient was obtained for treatment procedures. The patient understood the discussion of treatment and procedures well. All questions were answered thoroughly reviewed. Debridement of mycotic and hypertrophic toenails, 1 through 5 bilateral and clearing of subungual debris. No ulceration, no infection noted.  Return Visit-Office Procedure: Patient instructed to return to the office for a follow up visit 3 months for continued evaluation and treatment.

## 2015-02-06 ENCOUNTER — Ambulatory Visit: Payer: Medicare Other | Admitting: Podiatry

## 2015-02-11 ENCOUNTER — Encounter: Payer: Self-pay | Admitting: Podiatry

## 2015-02-11 ENCOUNTER — Ambulatory Visit (INDEPENDENT_AMBULATORY_CARE_PROVIDER_SITE_OTHER): Payer: Medicare Other | Admitting: Podiatry

## 2015-02-11 DIAGNOSIS — B351 Tinea unguium: Secondary | ICD-10-CM | POA: Diagnosis not present

## 2015-02-11 DIAGNOSIS — M79676 Pain in unspecified toe(s): Secondary | ICD-10-CM | POA: Diagnosis not present

## 2015-02-11 NOTE — Progress Notes (Signed)
Patient ID: Kelsey Robbins, female   DOB: 01/07/1919, 81 y.o.   MRN: [id number]   Subjective: This patient presents for a scheduled visit complaining of painful toenails when walking wearing shoes and is requesting toenail debridement  Objective: Orientated 3 No open skin lesions bilaterally The toenails are hypertrophic, brittle, elongated, discolored and tender direct palpation 6-10  Assessment: Symptomatic onychomycoses 6-10 Diabetic  Plan: Debrided toenails 10 and mechanically and electrically without any bleeding  Appointment 3 months

## 2015-02-11 NOTE — Patient Instructions (Signed)
Diabetes and Foot Care Diabetes may cause you to have problems because of poor blood supply (circulation) to your feet and legs. This may cause the skin on your feet to become thinner, break easier, and heal more slowly. Your skin may become dry, and the skin may peel and crack. You may also have nerve damage in your legs and feet causing decreased feeling in them. You may not notice minor injuries to your feet that could lead to infections or more serious problems. Taking care of your feet is one of the most important things you can do for yourself.  HOME CARE INSTRUCTIONS  Wear shoes at all times, even in the house. Do not go barefoot. Bare feet are easily injured.  Check your feet daily for blisters, cuts, and redness. If you cannot see the bottom of your feet, use a mirror or ask someone for help.  Wash your feet with warm water (do not use hot water) and mild soap. Then pat your feet and the areas between your toes until they are completely dry. Do not soak your feet as this can dry your skin.  Apply a moisturizing lotion or petroleum jelly (that does not contain alcohol and is unscented) to the skin on your feet and to dry, brittle toenails. Do not apply lotion between your toes.  Trim your toenails straight across. Do not dig under them or around the cuticle. File the edges of your nails with an emery board or nail file.  Do not cut corns or calluses or try to remove them with medicine.  Wear clean socks or stockings every day. Make sure they are not too tight. Do not wear knee-high stockings since they may decrease blood flow to your legs.  Wear shoes that fit properly and have enough cushioning. To break in new shoes, wear them for just a few hours a day. This prevents you from injuring your feet. Always look in your shoes before you put them on to be sure there are no objects inside.  Do not cross your legs. This may decrease the blood flow to your feet.  If you find a minor scrape,  cut, or break in the skin on your feet, keep it and the skin around it clean and dry. These areas may be cleansed with mild soap and water. Do not cleanse the area with peroxide, alcohol, or iodine.  When you remove an adhesive bandage, be sure not to damage the skin around it.  If you have a wound, look at it several times a day to make sure it is healing.  Do not use heating pads or hot water bottles. They may burn your skin. If you have lost feeling in your feet or legs, you may not know it is happening until it is too late.  Make sure your health care provider performs a complete foot exam at least annually or more often if you have foot problems. Report any cuts, sores, or bruises to your health care provider immediately. SEEK MEDICAL CARE IF:   You have an injury that is not healing.  You have cuts or breaks in the skin.  You have an ingrown nail.  You notice redness on your legs or feet.  You feel burning or tingling in your legs or feet.  You have pain or cramps in your legs and feet.  Your legs or feet are numb.  Your feet always feel cold. SEEK IMMEDIATE MEDICAL CARE IF:   There is increasing redness,   swelling, or pain in or around a wound.  There is a red line that goes up your leg.  Pus is coming from a wound.  You develop a fever or as directed by your health care provider.  You notice a bad smell coming from an ulcer or wound.   This information is not intended to replace advice given to you by your health care provider. Make sure you discuss any questions you have with your health care provider.   Document Released: 04/02/2000 Document Revised: 12/06/2012 Document Reviewed: 09/12/2012 Elsevier Interactive Patient Education 2016 [employer]  

## 2015-05-27 ENCOUNTER — Ambulatory Visit (INDEPENDENT_AMBULATORY_CARE_PROVIDER_SITE_OTHER): Payer: Medicare Other | Admitting: Podiatry

## 2015-05-27 ENCOUNTER — Encounter: Payer: Self-pay | Admitting: Podiatry

## 2015-05-27 DIAGNOSIS — B351 Tinea unguium: Secondary | ICD-10-CM

## 2015-05-27 DIAGNOSIS — M79676 Pain in unspecified toe(s): Secondary | ICD-10-CM | POA: Diagnosis not present

## 2015-05-27 NOTE — Patient Instructions (Signed)
Diabetes and Foot Care Diabetes may cause you to have problems because of poor blood supply (circulation) to your feet and legs. This may cause the skin on your feet to become thinner, break easier, and heal more slowly. Your skin may become dry, and the skin may peel and crack. You may also have nerve damage in your legs and feet causing decreased feeling in them. You may not notice minor injuries to your feet that could lead to infections or more serious problems. Taking care of your feet is one of the most important things you can do for yourself.  HOME CARE INSTRUCTIONS  Wear shoes at all times, even in the house. Do not go barefoot. Bare feet are easily injured.  Check your feet daily for blisters, cuts, and redness. If you cannot see the bottom of your feet, use a mirror or ask someone for help.  Wash your feet with warm water (do not use hot water) and mild soap. Then pat your feet and the areas between your toes until they are completely dry. Do not soak your feet as this can dry your skin.  Apply a moisturizing lotion or petroleum jelly (that does not contain alcohol and is unscented) to the skin on your feet and to dry, brittle toenails. Do not apply lotion between your toes.  Trim your toenails straight across. Do not dig under them or around the cuticle. File the edges of your nails with an emery board or nail file.  Do not cut corns or calluses or try to remove them with medicine.  Wear clean socks or stockings every day. Make sure they are not too tight. Do not wear knee-high stockings since they may decrease blood flow to your legs.  Wear shoes that fit properly and have enough cushioning. To break in new shoes, wear them for just a few hours a day. This prevents you from injuring your feet. Always look in your shoes before you put them on to be sure there are no objects inside.  Do not cross your legs. This may decrease the blood flow to your feet.  If you find a minor scrape,  cut, or break in the skin on your feet, keep it and the skin around it clean and dry. These areas may be cleansed with mild soap and water. Do not cleanse the area with peroxide, alcohol, or iodine.  When you remove an adhesive bandage, be sure not to damage the skin around it.  If you have a wound, look at it several times a day to make sure it is healing.  Do not use heating pads or hot water bottles. They may burn your skin. If you have lost feeling in your feet or legs, you may not know it is happening until it is too late.  Make sure your health care provider performs a complete foot exam at least annually or more often if you have foot problems. Report any cuts, sores, or bruises to your health care provider immediately. SEEK MEDICAL CARE IF:   You have an injury that is not healing.  You have cuts or breaks in the skin.  You have an ingrown nail.  You notice redness on your legs or feet.  You feel burning or tingling in your legs or feet.  You have pain or cramps in your legs and feet.  Your legs or feet are numb.  Your feet always feel cold. SEEK IMMEDIATE MEDICAL CARE IF:   There is increasing redness,   swelling, or pain in or around a wound.  There is a red line that goes up your leg.  Pus is coming from a wound.  You develop a fever or as directed by your health care provider.  You notice a bad smell coming from an ulcer or wound.   This information is not intended to replace advice given to you by your health care provider. Make sure you discuss any questions you have with your health care provider.   Document Released: 04/02/2000 Document Revised: 12/06/2012 Document Reviewed: 09/12/2012 Elsevier Interactive Patient Education 2016 [employer]  

## 2015-05-27 NOTE — Progress Notes (Signed)
Patient ID: Kelsey Robbins, female   DOB: May 07, 1918, 82 y.o.   MRN: [id number]  Subjective: This patient presents again for a scheduled visit complaining of elongated and thickened toenails and requests toenails debridement. Patient daughter is present in treatment room  Objective: Orientated 3 No open skin lesions bilaterally The toenails are elongated, brittle, hypertrophic, discolored and tender direct palpation 6-10  Assessment: Symptomatic onychomycoses 6-10 Diabetic  Plan: Debridement toenails 6-10 mechanically and left without any bleeding  Reappoint 3 months

## 2015-09-02 ENCOUNTER — Encounter: Payer: Self-pay | Admitting: Podiatry

## 2015-09-02 ENCOUNTER — Ambulatory Visit (INDEPENDENT_AMBULATORY_CARE_PROVIDER_SITE_OTHER): Payer: Medicare Other | Admitting: Podiatry

## 2015-09-02 DIAGNOSIS — B351 Tinea unguium: Secondary | ICD-10-CM

## 2015-09-02 DIAGNOSIS — M79676 Pain in unspecified toe(s): Secondary | ICD-10-CM | POA: Diagnosis not present

## 2015-09-02 NOTE — Progress Notes (Signed)
Patient ID: Kelsey Robbins, female   DOB: 06/22/1918, 82 y.o.   MRN: [id number]   Subjective: This patient presents again for a scheduled visit complaining of elongated and thickened toenails and requests toenails debridement. Patient daughter is present in treatment room  Objective: Orientated 3 No open skin lesions bilaterally The toenails are elongated, brittle, hypertrophic, discolored and tender direct palpation 6-10  Assessment: Symptomatic onychomycoses 6-10 Diabetic  Plan: Debridement toenails 6-10 mechanically and electrically without any bleeding  Reappoint 3 months

## 2015-09-02 NOTE — Patient Instructions (Signed)
Diabetes and Foot Care Diabetes may cause you to have problems because of poor blood supply (circulation) to your feet and legs. This may cause the skin on your feet to become thinner, break easier, and heal more slowly. Your skin may become dry, and the skin may peel and crack. You may also have nerve damage in your legs and feet causing decreased feeling in them. You may not notice minor injuries to your feet that could lead to infections or more serious problems. Taking care of your feet is one of the most important things you can do for yourself.  HOME CARE INSTRUCTIONS  Wear shoes at all times, even in the house. Do not go barefoot. Bare feet are easily injured.  Check your feet daily for blisters, cuts, and redness. If you cannot see the bottom of your feet, use a mirror or ask someone for help.  Wash your feet with warm water (do not use hot water) and mild soap. Then pat your feet and the areas between your toes until they are completely dry. Do not soak your feet as this can dry your skin.  Apply a moisturizing lotion or petroleum jelly (that does not contain alcohol and is unscented) to the skin on your feet and to dry, brittle toenails. Do not apply lotion between your toes.  Trim your toenails straight across. Do not dig under them or around the cuticle. File the edges of your nails with an emery board or nail file.  Do not cut corns or calluses or try to remove them with medicine.  Wear clean socks or stockings every day. Make sure they are not too tight. Do not wear knee-high stockings since they may decrease blood flow to your legs.  Wear shoes that fit properly and have enough cushioning. To break in new shoes, wear them for just a few hours a day. This prevents you from injuring your feet. Always look in your shoes before you put them on to be sure there are no objects inside.  Do not cross your legs. This may decrease the blood flow to your feet.  If you find a minor scrape,  cut, or break in the skin on your feet, keep it and the skin around it clean and dry. These areas may be cleansed with mild soap and water. Do not cleanse the area with peroxide, alcohol, or iodine.  When you remove an adhesive bandage, be sure not to damage the skin around it.  If you have a wound, look at it several times a day to make sure it is healing.  Do not use heating pads or hot water bottles. They may burn your skin. If you have lost feeling in your feet or legs, you may not know it is happening until it is too late.  Make sure your health care provider performs a complete foot exam at least annually or more often if you have foot problems. Report any cuts, sores, or bruises to your health care provider immediately. SEEK MEDICAL CARE IF:   You have an injury that is not healing.  You have cuts or breaks in the skin.  You have an ingrown nail.  You notice redness on your legs or feet.  You feel burning or tingling in your legs or feet.  You have pain or cramps in your legs and feet.  Your legs or feet are numb.  Your feet always feel cold. SEEK IMMEDIATE MEDICAL CARE IF:   There is increasing redness,   swelling, or pain in or around a wound.  There is a red line that goes up your leg.  Pus is coming from a wound.  You develop a fever or as directed by your health care provider.  You notice a bad smell coming from an ulcer or wound.   This information is not intended to replace advice given to you by your health care provider. Make sure you discuss any questions you have with your health care provider.   Document Released: 04/02/2000 Document Revised: 12/06/2012 Document Reviewed: 09/12/2012 Elsevier Interactive Patient Education 2016 [employer]  

## 2015-11-18 ENCOUNTER — Ambulatory Visit: Payer: Medicare Other | Admitting: Podiatry

## 2015-12-01 ENCOUNTER — Ambulatory Visit (INDEPENDENT_AMBULATORY_CARE_PROVIDER_SITE_OTHER): Payer: Medicare Other | Admitting: Sports Medicine

## 2015-12-01 DIAGNOSIS — M79676 Pain in unspecified toe(s): Secondary | ICD-10-CM

## 2015-12-01 DIAGNOSIS — E1140 Type 2 diabetes mellitus with diabetic neuropathy, unspecified: Secondary | ICD-10-CM

## 2015-12-01 DIAGNOSIS — M79673 Pain in unspecified foot: Secondary | ICD-10-CM

## 2015-12-01 DIAGNOSIS — B351 Tinea unguium: Secondary | ICD-10-CM | POA: Diagnosis not present

## 2015-12-01 NOTE — Progress Notes (Signed)
Subjective: Kelsey Robbins is a 82 y.o. female patient with history of diabetes who presents to office today complaining of long, painful nails  while ambulating in shoes; unable to trim. Patient states that the glucose reading this morning was 113 mg/dl; check by Doctor and home nurses. Patient denies any new changes in medication or new problems. Patient denies any new cramping, numbness, burning or tingling in the legs.  There are no active problems to display for this patient.  Current Outpatient Prescriptions on File Prior to Visit  Medication Sig Dispense Refill  . acetaminophen (TYLENOL) 100 MG/ML solution Take 10 mg/kg by mouth every 4 (four) hours as needed for fever.    Marland Kitchen. amLODipine (NORVASC) 10 MG tablet Take 10 mg by mouth daily.    Marland Kitchen. aspirin 81 MG tablet Take 81 mg by mouth daily.    . calcium carbonate (OS-CAL - DOSED IN MG OF ELEMENTAL CALCIUM) 1250 MG tablet Take 1 tablet by mouth.    . Calcium Carbonate Antacid (TUMS E-X PO) Take by mouth.    . enalapril (VASOTEC) 10 MG tablet Take 10 mg by mouth daily.    . fluocinonide cream (LIDEX) 0.05 %   0  . gabapentin (NEURONTIN) 300 MG capsule   10  . glimepiride (AMARYL) 2 MG tablet   10  . hydrochlorothiazide (HYDRODIURIL) 25 MG tablet Take 25 mg by mouth daily.    Marland Kitchen. labetalol (NORMODYNE) 100 MG tablet   10  . Lactase (LACTAID ULTRA) 9000 UNITS TABS Take by mouth.    . omega-3 acid ethyl esters (LOVAZA) 1 G capsule Take by mouth 2 (two) times daily.    . sertraline (ZOLOFT) 100 MG tablet   10  . simvastatin (ZOCOR) 20 MG tablet   10   No current facility-administered medications on file prior to visit.    No Known Allergies  No results found for this or any previous visit (from the past 2160 hour(s)).  Objective: General: Patient is awake, alert, and oriented x 3 and in no acute distress.  Integument: Skin is warm, dry and supple bilateral. Nails are tender, long, thickened and dystrophic with subungual debris,  consistent with onychomycosis, 1-5 bilateral. No signs of infection. No open lesions or preulcerative lesions present bilateral. Remaining integument unremarkable.  Vasculature:  Dorsalis Pedis pulse 2/4 bilateral. Posterior Tibial pulse  1/4 bilateral. Capillary fill time <3 sec 1-5 bilateral. Positive hair growth to the level of the digits.Temperature gradient within normal limits. No varicosities present bilateral. No edema present bilateral.   Neurology: The patient has diminished sensation measured with a 5.07/10g Semmes Weinstein Monofilament at all pedal sites bilateral . Vibratory sensation diminished bilateral with tuning fork. No Babinski sign present bilateral.   Musculoskeletal:  Asymptomatic hammertoe pedal deformities noted bilateral. Muscular strength 5/5 in all lower extremity muscular groups bilateral without pain on range of motion . No tenderness with calf compression bilateral.  Assessment and Plan: Problem List Items Addressed This Visit    None    Visit Diagnoses    Dermatophytosis of nail    -  Primary   Pain of toe, unspecified laterality       Type 2 diabetes, controlled, with neuropathy (HCC)       Foot pain, unspecified laterality          -Examined patient. -Discussed and educated patient on diabetic foot care, especially with  regards to the vascular, neurological and musculoskeletal systems.  -Stressed the importance of good glycemic control and  the detriment of not  controlling glucose levels in relation to the foot. -Mechanically debrided all nails 1-5 bilateral using sterile nail nipper and filed with dremel without incident  -Recommend good supportive shoes for foot type daily -Answered all patient questions -Patient to return  in 3 months for at risk foot care -Patient advised to call the office if any problems or questions arise in the meantime.  Asencion Islamitorya Wooden, DPM

## 2016-02-03 ENCOUNTER — Encounter: Payer: Self-pay | Admitting: Sports Medicine

## 2016-02-03 ENCOUNTER — Ambulatory Visit (INDEPENDENT_AMBULATORY_CARE_PROVIDER_SITE_OTHER): Payer: Medicare Other | Admitting: Sports Medicine

## 2016-02-03 DIAGNOSIS — M79676 Pain in unspecified toe(s): Secondary | ICD-10-CM

## 2016-02-03 DIAGNOSIS — E1140 Type 2 diabetes mellitus with diabetic neuropathy, unspecified: Secondary | ICD-10-CM

## 2016-02-03 DIAGNOSIS — B351 Tinea unguium: Secondary | ICD-10-CM

## 2016-02-03 NOTE — Progress Notes (Signed)
Subjective: Kelsey Robbins is a 82 y.o. female patient with history of diabetes who presents to office today complaining of long, painful nails  while ambulating in shoes; unable to trim. Patient states that the glucose reading this morning was not recorded; last checked by her Doctor and was good. Patient denies any new changes in medication or new problems. Patient denies any new cramping, numbness, burning or tingling in the legs.  There are no active problems to display for this patient.  Current Outpatient Prescriptions on File Prior to Visit  Medication Sig Dispense Refill  . acetaminophen (TYLENOL) 100 MG/ML solution Take 10 mg/kg by mouth every 4 (four) hours as needed for fever.    Marland Kitchen. amLODipine (NORVASC) 10 MG tablet Take 10 mg by mouth daily.    Marland Kitchen. aspirin 81 MG tablet Take 81 mg by mouth daily.    . calcium carbonate (OS-CAL - DOSED IN MG OF ELEMENTAL CALCIUM) 1250 MG tablet Take 1 tablet by mouth.    . Calcium Carbonate Antacid (TUMS E-X PO) Take by mouth.    . enalapril (VASOTEC) 10 MG tablet Take 10 mg by mouth daily.    . fluocinonide cream (LIDEX) 0.05 %   0  . gabapentin (NEURONTIN) 300 MG capsule   10  . glimepiride (AMARYL) 2 MG tablet   10  . hydrochlorothiazide (HYDRODIURIL) 25 MG tablet Take 25 mg by mouth daily.    Marland Kitchen. labetalol (NORMODYNE) 100 MG tablet   10  . Lactase (LACTAID ULTRA) 9000 UNITS TABS Take by mouth.    . omega-3 acid ethyl esters (LOVAZA) 1 G capsule Take by mouth 2 (two) times daily.    . sertraline (ZOLOFT) 100 MG tablet   10  . simvastatin (ZOCOR) 20 MG tablet   10   No current facility-administered medications on file prior to visit.    No Known Allergies  No results found for this or any previous visit (from the past 2160 hour(s)).  Objective: General: Patient is awake, alert, and oriented x 3 and in no acute distress.  Integument: Skin is warm, dry and supple bilateral. Nails are tender, long, thickened and dystrophic with subungual  debris, consistent with onychomycosis, 1-5 bilateral. No signs of infection. No open lesions or preulcerative lesions present bilateral. Remaining integument unremarkable.  Vasculature:  Dorsalis Pedis pulse 2/4 bilateral. Posterior Tibial pulse  1/4 bilateral. Capillary fill time <3 sec 1-5 bilateral. Positive hair growth to the level of the digits.Temperature gradient within normal limits. No varicosities present bilateral. No edema present bilateral.   Neurology: The patient has diminished sensation measured with a 5.07/10g Semmes Weinstein Monofilament at all pedal sites bilateral . Vibratory sensation diminished bilateral with tuning fork. No Babinski sign present bilateral.   Musculoskeletal:  Asymptomatic hammertoe pedal deformities noted bilateral. Muscular strength 5/5 in all lower extremity muscular groups bilateral without pain on range of motion . No tenderness with calf compression bilateral.  Assessment and Plan: Problem List Items Addressed This Visit    None    Visit Diagnoses    Dermatophytosis of nail    -  Primary   Pain of toe, unspecified laterality       Type 2 diabetes, controlled, with neuropathy (HCC)          -Examined patient. -Discussed and educated patient on diabetic foot care, especially with  regards to the vascular, neurological and musculoskeletal systems.  -Stressed the importance of good glycemic control and the detriment of not controlling glucose levels in  relation to the foot. -Mechanically debrided all nails 1-5 bilateral using sterile nail nipper and filed with dremel without incident  -Recommend good supportive shoes for foot type daily -Answered all patient questions -Patient to return  in 3 months for at risk foot care -Patient advised to call the office if any problems or questions arise in the meantime.  Asencion Islam, DPM

## 2016-04-20 ENCOUNTER — Encounter: Payer: Self-pay | Admitting: Sports Medicine

## 2016-04-20 ENCOUNTER — Ambulatory Visit (INDEPENDENT_AMBULATORY_CARE_PROVIDER_SITE_OTHER): Payer: Medicare Other | Admitting: Sports Medicine

## 2016-04-20 DIAGNOSIS — B351 Tinea unguium: Secondary | ICD-10-CM

## 2016-04-20 DIAGNOSIS — E1140 Type 2 diabetes mellitus with diabetic neuropathy, unspecified: Secondary | ICD-10-CM

## 2016-04-20 DIAGNOSIS — M79676 Pain in unspecified toe(s): Secondary | ICD-10-CM

## 2016-04-20 NOTE — Progress Notes (Addendum)
Subjective: Kelsey Robbins is a 83 y.o. female patient with history of diabetes who presents to office today complaining of long, painful nails  while ambulating in shoes; unable to trim. Patient states that the glucose reading this morning was "ok". Patient denies any new changes in medication or new problems. Patient denies any new cramping, numbness, burning or tingling in the legs.  There are no active problems to display for this patient.  Current Outpatient Prescriptions on File Prior to Visit  Medication Sig Dispense Refill  . acetaminophen (TYLENOL) 100 MG/ML solution Take 10 mg/kg by mouth every 4 (four) hours as needed for fever.    Marland Kitchen amLODipine (NORVASC) 10 MG tablet Take 10 mg by mouth daily.    Marland Kitchen aspirin 81 MG tablet Take 81 mg by mouth daily.    . calcium carbonate (OS-CAL - DOSED IN MG OF ELEMENTAL CALCIUM) 1250 MG tablet Take 1 tablet by mouth.    . Calcium Carbonate Antacid (TUMS E-X PO) Take by mouth.    . enalapril (VASOTEC) 10 MG tablet Take 10 mg by mouth daily.    . fluocinonide cream (LIDEX) 0.05 %   0  . gabapentin (NEURONTIN) 300 MG capsule   10  . glimepiride (AMARYL) 2 MG tablet   10  . hydrochlorothiazide (HYDRODIURIL) 25 MG tablet Take 25 mg by mouth daily.    Marland Kitchen labetalol (NORMODYNE) 100 MG tablet   10  . Lactase (LACTAID ULTRA) 9000 UNITS TABS Take by mouth.    . omega-3 acid ethyl esters (LOVAZA) 1 G capsule Take by mouth 2 (two) times daily.    . sertraline (ZOLOFT) 100 MG tablet   10  . simvastatin (ZOCOR) 20 MG tablet   10   No current facility-administered medications on file prior to visit.    No Known Allergies  No results found for this or any previous visit (from the past 2160 hour(s)).  Objective: General: Patient is awake, alert, and oriented x 3 and in no acute distress.  Integument: Skin is warm, dry and supple bilateral. Nails are tender, long, thickened and dystrophic with subungual debris, consistent with onychomycosis, 1-5 bilateral.  No signs of infection. No open lesions or preulcerative lesions present bilateral. Remaining integument unremarkable.  Vasculature:  Dorsalis Pedis pulse 2/4 bilateral. Posterior Tibial pulse  1/4 bilateral. Capillary fill time <3 sec 1-5 bilateral. Positive hair growth to the level of the digits.Temperature gradient within normal limits. No varicosities present bilateral. No edema present bilateral.   Neurology: The patient has diminished sensation measured with a 5.07/10g Semmes Weinstein Monofilament at all pedal sites bilateral. Vibratory sensation diminished bilateral with tuning fork. No Babinski sign present bilateral.   Musculoskeletal:  Asymptomatic hammertoe pedal deformities noted bilateral. Muscular strength 5/5 in all lower extremity muscular groups bilateral without pain on range of motion . No tenderness with calf compression bilateral.  Assessment and Plan: Problem List Items Addressed This Visit    None    Visit Diagnoses    Dermatophytosis of nail    -  Primary   Pain of toe, unspecified laterality       Type 2 diabetes, controlled, with neuropathy (HCC)         -Examined patient. -Discussed and educated patient on diabetic foot care, especially with  regards to the vascular, neurological and musculoskeletal systems.  -Stressed the importance of good glycemic control and the detriment of not controlling glucose levels in relation to the foot. -Mechanically debrided all nails 1-5 bilateral using  sterile nail nipper and filed with dremel without incident  -Recommend good supportive shoes for foot type daily -Answered all patient questions -Patient to return  in 62 days for at risk foot care -Patient advised to call the office if any problems or questions arise in the meantime.  Asencion Islamitorya Bernath, DPM

## 2016-05-04 ENCOUNTER — Ambulatory Visit: Payer: Medicare Other | Admitting: Sports Medicine

## 2016-06-22 ENCOUNTER — Ambulatory Visit: Payer: Medicare Other | Admitting: Sports Medicine

## 2016-06-29 ENCOUNTER — Ambulatory Visit: Payer: Medicare Other | Admitting: Sports Medicine

## 2016-07-08 ENCOUNTER — Ambulatory Visit (INDEPENDENT_AMBULATORY_CARE_PROVIDER_SITE_OTHER): Payer: Medicare Other | Admitting: Podiatrist

## 2016-07-08 ENCOUNTER — Encounter: Payer: Self-pay | Admitting: Podiatrist

## 2016-07-08 DIAGNOSIS — B351 Tinea unguium: Secondary | ICD-10-CM | POA: Diagnosis not present

## 2016-07-08 DIAGNOSIS — E1140 Type 2 diabetes mellitus with diabetic neuropathy, unspecified: Secondary | ICD-10-CM

## 2016-07-08 DIAGNOSIS — M79676 Pain in unspecified toe(s): Secondary | ICD-10-CM | POA: Diagnosis not present

## 2016-07-08 NOTE — Progress Notes (Signed)
HPI:  Patient presents today for follow up of foot and nail care. Denies any new complaints today. Relates blood sugar is under control.    General: Patient is awake, alert, and oriented x 3 and in no acute distress.  Integument: Skin is warm, dry and supple bilateral. Nails are tender, long, thickened and dystrophic with subungual debris, consistent with onychomycosis, 1-5 bilateral. No signs of infection. No open lesions or preulcerative lesions present bilateral. Remaining integument unremarkable.  Vasculature:  Dorsalis Pedis pulse 2/4 bilateral. Posterior Tibial pulse  1/4 bilateral. Capillary fill time <3 sec 1-5 bilateral. Positive hair growth to the level of the digits.Temperature gradient within normal limits. No varicosities present bilateral. No edema present bilateral.   Neurology: The patient has diminished sensation measured with a 5.07/10g Semmes Weinstein Monofilament at all pedal sites bilateral. Vibratory sensation diminished bilateral with tuning fork. No Babinski sign present bilateral.   Musculoskeletal:  Asymptomatic hammertoe pedal deformities noted bilateral. Muscular strength 5/5 in all lower extremity muscular groups bilateral without pain on range of motion .  Assessment:  Symptomatic onychomycosis, type 2 diabetes   Plan:  Discussed treatment options and alternatives.  The symptomatic toenails were debrided through manual an mechanical means without complication.  Return appointment recommended at routine intervals of 62 days to 3 months with Dr. Baird CancerStover    Kyrielle Urbanski, DPM

## 2016-07-20 ENCOUNTER — Ambulatory Visit: Payer: Medicare Other | Admitting: Sports Medicine

## 2016-09-07 ENCOUNTER — Ambulatory Visit (INDEPENDENT_AMBULATORY_CARE_PROVIDER_SITE_OTHER): Payer: Medicare Other | Admitting: Sports Medicine

## 2016-09-07 ENCOUNTER — Encounter: Payer: Self-pay | Admitting: Sports Medicine

## 2016-09-07 DIAGNOSIS — B351 Tinea unguium: Secondary | ICD-10-CM | POA: Diagnosis not present

## 2016-09-07 DIAGNOSIS — M79676 Pain in unspecified toe(s): Secondary | ICD-10-CM

## 2016-09-07 DIAGNOSIS — E1140 Type 2 diabetes mellitus with diabetic neuropathy, unspecified: Secondary | ICD-10-CM

## 2016-09-07 NOTE — Progress Notes (Signed)
Subjective: Kelsey EatonBeatrice R Muzio is a 83 y.o. female patient with history of diabetes who presents to office today complaining of long, painful nails  while ambulating in shoes; unable to trim. Patient states that the glucose reading this morning was "good". Patient denies any new changes in medication or new problems. Patient denies any new cramping, numbness, burning or tingling in the legs. Admits to restless leg symptoms.   There are no active problems to display for this patient.  Current Outpatient Prescriptions on File Prior to Visit  Medication Sig Dispense Refill  . acetaminophen (TYLENOL) 100 MG/ML solution Take 10 mg/kg by mouth every 4 (four) hours as needed for fever.    Marland Kitchen. amLODipine (NORVASC) 10 MG tablet Take 10 mg by mouth daily.    Marland Kitchen. aspirin 81 MG tablet Take 81 mg by mouth daily.    . calcium carbonate (OS-CAL - DOSED IN MG OF ELEMENTAL CALCIUM) 1250 MG tablet Take 1 tablet by mouth.    . Calcium Carbonate Antacid (TUMS E-X PO) Take by mouth.    . enalapril (VASOTEC) 10 MG tablet Take 10 mg by mouth daily.    . fluocinonide cream (LIDEX) 0.05 %   0  . gabapentin (NEURONTIN) 300 MG capsule   10  . glimepiride (AMARYL) 2 MG tablet   10  . hydrochlorothiazide (HYDRODIURIL) 25 MG tablet Take 25 mg by mouth daily.    Marland Kitchen. labetalol (NORMODYNE) 100 MG tablet   10  . Lactase (LACTAID ULTRA) 9000 UNITS TABS Take by mouth.    . omega-3 acid ethyl esters (LOVAZA) 1 G capsule Take by mouth 2 (two) times daily.    . sertraline (ZOLOFT) 100 MG tablet   10  . simvastatin (ZOCOR) 20 MG tablet   10   No current facility-administered medications on file prior to visit.    No Known Allergies  No results found for this or any previous visit (from the past 2160 hour(s)).  Objective: General: Patient is awake, alert, and oriented x 3 and in no acute distress.  Integument: Skin is warm, dry and supple bilateral. Nails are tender, long, thickened and dystrophic with subungual debris, consistent  with onychomycosis, 1-5 bilateral. No signs of infection. No open lesions or preulcerative lesions present bilateral. Remaining integument unremarkable.  Vasculature:  Dorsalis Pedis pulse 2/4 bilateral. Posterior Tibial pulse  1/4 bilateral. Capillary fill time <3 sec 1-5 bilateral. Positive hair growth to the level of the digits.Temperature gradient within normal limits. No varicosities present bilateral. No edema present bilateral.   Neurology: The patient has diminished sensation measured with a 5.07/10g Semmes Weinstein Monofilament at all pedal sites bilateral. Vibratory sensation diminished bilateral with tuning fork. No Babinski sign present bilateral.   Musculoskeletal:  Asymptomatic hammertoe pedal deformities noted bilateral. Muscular strength 5/5 in all lower extremity muscular groups bilateral without pain on range of motion . No tenderness with calf compression bilateral.  Assessment and Plan: Problem List Items Addressed This Visit    None    Visit Diagnoses    Pain due to onychomycosis of toenail    -  Primary   Type 2 diabetes, controlled, with neuropathy (HCC)         -Examined patient. -Discussed and educated patient on diabetic foot care, especially with  regards to the vascular, neurological and musculoskeletal systems.  -Stressed the importance of good glycemic control and the detriment of not controlling glucose levels in relation to the foot. -Mechanically debrided all nails 1-5 bilateral using sterile nail  nipper and filed with dremel without incident  -Recommend good supportive shoes for foot type daily and discuss with PCP possible medicaiton/options for restless leg  -Answered all patient questions -Patient to return  in 2.5 to 3 months for at risk foot care -Patient advised to call the office if any problems or questions arise in the meantime.  Asencion Islam, DPM

## 2016-09-28 ENCOUNTER — Encounter (HOSPITAL_COMMUNITY): Payer: Self-pay | Admitting: *Deleted

## 2016-09-28 ENCOUNTER — Emergency Department (HOSPITAL_COMMUNITY): Payer: Medicare Other

## 2016-09-28 ENCOUNTER — Emergency Department (HOSPITAL_COMMUNITY)
Admission: EM | Admit: 2016-09-28 | Discharge: 2016-09-28 | Disposition: A | Discharge status: Home / Self Care | Payer: Medicare Other | Attending: Emergency Medicine | Admitting: Emergency Medicine

## 2016-09-28 DIAGNOSIS — Z79899 Other long term (current) drug therapy: Secondary | ICD-10-CM | POA: Diagnosis not present

## 2016-09-28 DIAGNOSIS — Z7984 Long term (current) use of oral hypoglycemic drugs: Secondary | ICD-10-CM | POA: Insufficient documentation

## 2016-09-28 DIAGNOSIS — Y999 Unspecified external cause status: Secondary | ICD-10-CM | POA: Insufficient documentation

## 2016-09-28 DIAGNOSIS — Z7982 Long term (current) use of aspirin: Secondary | ICD-10-CM | POA: Diagnosis not present

## 2016-09-28 DIAGNOSIS — Y92129 Unspecified place in nursing home as the place of occurrence of the external cause: Secondary | ICD-10-CM | POA: Diagnosis not present

## 2016-09-28 DIAGNOSIS — S00211A Abrasion of right eyelid and periocular area, initial encounter: Secondary | ICD-10-CM

## 2016-09-28 DIAGNOSIS — S0990XA Unspecified injury of head, initial encounter: Secondary | ICD-10-CM | POA: Diagnosis present

## 2016-09-28 DIAGNOSIS — S0083XA Contusion of other part of head, initial encounter: Secondary | ICD-10-CM

## 2016-09-28 DIAGNOSIS — S0181XA Laceration without foreign body of other part of head, initial encounter: Secondary | ICD-10-CM | POA: Diagnosis not present

## 2016-09-28 DIAGNOSIS — S0011XA Contusion of right eyelid and periocular area, initial encounter: Secondary | ICD-10-CM

## 2016-09-28 DIAGNOSIS — W06XXXA Fall from bed, initial encounter: Secondary | ICD-10-CM | POA: Diagnosis not present

## 2016-09-28 DIAGNOSIS — Y939 Activity, unspecified: Secondary | ICD-10-CM | POA: Insufficient documentation

## 2016-09-28 DIAGNOSIS — W19XXXA Unspecified fall, initial encounter: Secondary | ICD-10-CM

## 2016-09-28 DIAGNOSIS — Y92009 Unspecified place in unspecified non-institutional (private) residence as the place of occurrence of the external cause: Secondary | ICD-10-CM

## 2016-09-28 MED ORDER — LIDOCAINE-EPINEPHRINE (PF) 2 %-1:200000 IJ SOLN
INTRAMUSCULAR | Status: AC
  Filled 2016-09-28: qty 20

## 2016-09-28 NOTE — ED Provider Notes (Signed)
[facility]-EMERGENCY DEPT Provider Note: Lowella DellJ. Lane Sarayu Prevost, MD, FACEP  CSN: [id number] MRN: [id number] ARRIVAL: 09/28/16 at 0432 ROOM: [facility]15/[facility]15   CHIEF COMPLAINT  Fall   HISTORY OF PRESENT ILLNESS  Kelsey Robbins is a 83 y.o. female who was watching TV this morning at her living facility just prior to arrival and accidentally fell and she was getting out of bed. She hit her head on the nightstand. She has a laceration to her mid forehead and ecchymosis to her right forehead. There was significant bleeding prior to arrival but the wound is now hemostatic after bandaging. She is not on any anticoagulants. She denies pain in her head or neck. There was no loss of consciousness and she remembers the fall.   History reviewed. No pertinent past medical history.  History reviewed. No pertinent surgical history.  No family history on file.  Social History  Substance Use Topics  . Smoking status: Never Smoker  . Smokeless tobacco: Never Used  . Alcohol use No    Prior to Admission medications   Medication Sig Start Date End Date Taking? Authorizing Provider  acetaminophen (TYLENOL) 100 MG/ML solution Take 10 mg/kg by mouth every 4 (four) hours as needed for fever.    [provider]  amLODipine (NORVASC) 10 MG tablet Take 10 mg by mouth daily.    [provider]  aspirin 81 MG tablet Take 81 mg by mouth daily.    [provider]  calcium carbonate (OS-CAL - DOSED IN MG OF ELEMENTAL CALCIUM) 1250 MG tablet Take 1 tablet by mouth.    [provider]  Calcium Carbonate Antacid (TUMS E-X PO) Take by mouth.    [provider]  enalapril (VASOTEC) 10 MG tablet Take 10 mg by mouth daily.    [provider]  fluocinonide cream (LIDEX) 0.05 %  09/05/14   [provider]  gabapentin (NEURONTIN) 300 MG capsule  09/24/14   [provider]  glimepiride (AMARYL) 2 MG tablet  09/24/14   [provider]  hydrochlorothiazide  (HYDRODIURIL) 25 MG tablet Take 25 mg by mouth daily.    [provider]  labetalol (NORMODYNE) 100 MG tablet  09/24/14   [provider]  Lactase (LACTAID ULTRA) 9000 UNITS TABS Take by mouth.    [provider]  omega-3 acid ethyl esters (LOVAZA) 1 G capsule Take by mouth 2 (two) times daily.    [provider]  sertraline (ZOLOFT) 100 MG tablet  09/24/14   [provider]  simvastatin (ZOCOR) 20 MG tablet  09/24/14   [provider]    Allergies Patient has no known allergies.   REVIEW OF SYSTEMS  Negative except as noted here or in the History of Present Illness.   PHYSICAL EXAMINATION  Initial Vital Signs Blood pressure (!) 192/69, pulse 73, temperature 98.1 F (36.7 C), temperature source Oral, resp. rate 16, SpO2 92 %.  Examination General: Well-developed, well-nourished female in no acute distress; appearance consistent with age of record HENT: normocephalic; Laceration mid forehead; ecchymosis right forehead; ecchymosis and superficial abrasion right eyebrow; no hemotympanum Eyes: pupils equal, round and reactive to light; extraocular muscles intact; lens implants Neck: supple; no C-spine tenderness Heart: regular rate and rhythm Lungs: clear to auscultation bilaterally Chest: No tenderness Abdomen: soft; nondistended; nontender; bowel sounds present Extremities: No acute bony deformity; pulses normal; no tenderness on passive range of motion Neurologic: Awake, alert and oriented; motor function intact in all extremities and symmetric; no facial droop  Skin: Warm and dry Psychiatric: Normal mood and affect   RESULTS  Summary of this visit's results, reviewed by myself:   EKG Interpretation  Date/Time:    Ventricular Rate:    PR Interval:    QRS Duration:   QT Interval:    QTC Calculation:   R Axis:     Text Interpretation:        Laboratory Studies: No results found for this or any previous visit (from the  past 24 hour(s)). Imaging Studies: Ct Head Wo Contrast  Result Date: 09/28/2016 CLINICAL DATA:  Tripped and fell getting out of bed, struck head on night stand. No loss of consciousness. Forehead laceration. EXAM: CT HEAD WITHOUT CONTRAST TECHNIQUE: Contiguous axial images were obtained from the base of the skull through the vertex without intravenous contrast. COMPARISON:  None. FINDINGS: BRAIN: No intraparenchymal hemorrhage, mass effect nor midline shift. The ventricles and sulci are normal for age, cavum septum pellucidum. Patchy supratentorial white matter hypodensities within normal range for patient's age, though non-specific are most compatible with chronic small vessel ischemic disease. Old small RIGHT cerebellar infarcts. No acute large vascular territory infarcts. No abnormal extra-axial fluid collections. Basal cisterns are patent. VASCULAR: Severe calcific atherosclerosis of the carotid siphons. SKULL: No skull fracture. Osteopenia. Small RIGHT frontal scalp hematoma without subcutaneous gas or radiopaque foreign bodies. SINUSES/ORBITS: The mastoid air-cells and included paranasal sinuses are well-aerated.The included ocular globes and orbital contents are non-suspicious. Status post bilateral ocular lens implants. OTHER: None. IMPRESSION: No acute intracranial process. Small RIGHT frontal scalp hematoma without skull fracture. Moderate chronic small vessel ischemic disease and severe atherosclerosis. 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 Electronically Signed   By: Awilda Metro M.D.   On: 09/28/2016 06:29    ED COURSE  Nursing notes and initial vitals signs, including pulse oximetry, reviewed.  Vitals:   09/28/16 0440  BP: (!) 192/69  Pulse: 73  Resp: 16  Temp: 98.1 F (36.7 C)  TempSrc: Oral  SpO2: 92%    PROCEDURES   LACERATION REPAIR Performed by: Hanley Seamen Authorized by: Hanley Seamen Consent: Verbal consent obtained. Risks and benefits: risks, benefits  and alternatives were discussed Consent given by: patient Patient identity confirmed: provided demographic data Prepped and Draped in normal sterile fashion Wound explored  Laceration Location: Mid forehead  Laceration Length: 3 cm  No Foreign Bodies seen or palpated  Anesthesia: local infiltration  Local anesthetic: lidocaine 2 % with epinephrine  Anesthetic total: 4 ml  Irrigation method: syringe Amount of cleaning: standard  Skin closure: 5-0 nylon   Number of sutures: One   Technique: Running  Patient tolerance: Patient tolerated the procedure well with no immediate complications.   ED DIAGNOSES     ICD-10-CM   1. Fall in home, initial encounter W19.XXXA    Y92.009   2. Laceration of forehead, initial encounter S01.81XA   3. Contusion of forehead, initial encounter S00.83XA   4. Contusion of right eyebrow, initial encounter S00.11XA   5. Abrasion of right eyebrow, initial encounter Z61.096E        Paula Libra, MD 09/28/16 [phone]

## 2016-09-28 NOTE — ED Notes (Signed)
Made [facility] aware of transport back to [facility].

## 2016-09-28 NOTE — ED Notes (Signed)
Family at bedside. 

## 2016-09-28 NOTE — ED Triage Notes (Signed)
[facility] EMS [facility] ([address]), with c/o fall. Per EMS pt was getting out of bed, tripped and fell, hitting her head on the nightstand. Per EMS pt has 1 inch laceration to the middle of her forehead, bandage applied. Pt denies LOC, not on blood thinners. Pt sts no pain at this time.

## 2016-11-10 ENCOUNTER — Ambulatory Visit (INDEPENDENT_AMBULATORY_CARE_PROVIDER_SITE_OTHER): Payer: Medicare Other | Admitting: Podiatry

## 2016-11-10 ENCOUNTER — Encounter: Payer: Self-pay | Admitting: Podiatry

## 2016-11-10 DIAGNOSIS — M79676 Pain in unspecified toe(s): Secondary | ICD-10-CM

## 2016-11-10 DIAGNOSIS — B351 Tinea unguium: Secondary | ICD-10-CM

## 2016-11-10 DIAGNOSIS — E1140 Type 2 diabetes mellitus with diabetic neuropathy, unspecified: Secondary | ICD-10-CM

## 2016-11-10 NOTE — Progress Notes (Signed)
Complaint:  Visit Type: Patient returns to my office for continued preventative foot care services. Complaint: Patient states" my nails have grown long and thick and become painful to walk and wear shoes" Patient has been diagnosed with DM with neuropathy.. The patient presents for preventative foot care services. No changes to ROS  Podiatric Exam: Vascular: dorsalis pedis and posterior tibial pulses are palpable bilateral. Capillary return is immediate. Temperature gradient is WNL. Skin turgor WNL  Sensorium: Diminished  Semmes Weinstein monofilament test. Normal tactile sensation bilaterally. Nail Exam: Pt has thick disfigured discolored nails with subungual debris noted bilateral entire nail hallux through fifth toenails Ulcer Exam: There is no evidence of ulcer or pre-ulcerative changes or infection. Orthopedic Exam: Muscle tone and strength are WNL. No limitations in general ROM. No crepitus or effusions noted. Foot type and digits show no abnormalities. Bony prominences are unremarkable. Skin: No Porokeratosis. No infection or ulcers  Diagnosis:  Onychomycosis, , Pain in right toe, pain in left toes  Treatment & Plan Procedures and Treatment: Consent by patient was obtained for treatment procedures. The patient understood the discussion of treatment and procedures well. All questions were answered thoroughly reviewed. Debridement of mycotic and hypertrophic toenails, 1 through 5 bilateral and clearing of subungual debris. No ulceration, no infection noted.  Return Visit-Office Procedure: Patient instructed to return to the office for a follow up visit 9 weeks  for continued evaluation and treatment.    Helane GuntherGregory Achillies Buehl DPM

## 2017-01-12 ENCOUNTER — Ambulatory Visit (INDEPENDENT_AMBULATORY_CARE_PROVIDER_SITE_OTHER): Payer: Medicare Other | Admitting: Podiatry

## 2017-01-12 ENCOUNTER — Encounter: Payer: Self-pay | Admitting: Podiatry

## 2017-01-12 DIAGNOSIS — M79676 Pain in unspecified toe(s): Secondary | ICD-10-CM | POA: Diagnosis not present

## 2017-01-12 DIAGNOSIS — B351 Tinea unguium: Secondary | ICD-10-CM

## 2017-01-12 DIAGNOSIS — E1140 Type 2 diabetes mellitus with diabetic neuropathy, unspecified: Secondary | ICD-10-CM

## 2017-01-12 NOTE — Progress Notes (Signed)
Complaint:  Visit Type: Patient returns to my office for continued preventative foot care services. Complaint: Patient states" my nails have grown long and thick and become painful to walk and wear shoes" Patient has been diagnosed with DM with neuropathy.. The patient presents for preventative foot care services. No changes to ROS  Podiatric Exam: Vascular: dorsalis pedis and posterior tibial pulses are palpable bilateral. Capillary return is immediate. Temperature gradient is WNL. Skin turgor WNL  Sensorium: Diminished  Semmes Weinstein monofilament test. Normal tactile sensation bilaterally. Nail Exam: Pt has thick disfigured discolored nails with subungual debris noted bilateral entire nail hallux through fifth toenails Ulcer Exam: There is no evidence of ulcer or pre-ulcerative changes or infection. Orthopedic Exam: Muscle tone and strength are WNL. No limitations in general ROM. No crepitus or effusions noted. Foot type and digits show no abnormalities. Bony prominences are unremarkable. Skin: No Porokeratosis. No infection or ulcers  Diagnosis:  Onychomycosis, , Pain in right toe, pain in left toes  Treatment & Plan Procedures and Treatment: Consent by patient was obtained for treatment procedures. The patient understood the discussion of treatment and procedures well. All questions were answered thoroughly reviewed. Debridement of mycotic and hypertrophic toenails, 1 through 5 bilateral and clearing of subungual debris. No ulceration, no infection noted.  Return Visit-Office Procedure: Patient instructed to return to the office for a follow up visit 10  weeks  for continued evaluation and treatment.    Helane Gunther DPM

## 2017-03-25 ENCOUNTER — Ambulatory Visit: Payer: Medicare Other | Admitting: Podiatry

## 2017-04-08 ENCOUNTER — Ambulatory Visit: Payer: Medicare Other | Admitting: Podiatry

## 2017-04-27 ENCOUNTER — Ambulatory Visit (INDEPENDENT_AMBULATORY_CARE_PROVIDER_SITE_OTHER): Payer: Medicare Other | Admitting: Podiatry

## 2017-04-27 ENCOUNTER — Encounter: Payer: Self-pay | Admitting: Podiatry

## 2017-04-27 DIAGNOSIS — E1140 Type 2 diabetes mellitus with diabetic neuropathy, unspecified: Secondary | ICD-10-CM | POA: Diagnosis not present

## 2017-04-27 DIAGNOSIS — B351 Tinea unguium: Secondary | ICD-10-CM

## 2017-04-27 DIAGNOSIS — M79676 Pain in unspecified toe(s): Secondary | ICD-10-CM

## 2017-04-27 NOTE — Progress Notes (Signed)
Complaint:  Visit Type: Patient returns to my office for continued preventative foot care services. Complaint: Patient states" my nails have grown long and thick and become painful to walk and wear shoes" Patient has been diagnosed with DM with neuropathy.. The patient presents for preventative foot care services. No changes to ROS  Podiatric Exam: Vascular: dorsalis pedis and posterior tibial pulses are palpable bilateral. Capillary return is immediate. Temperature gradient is WNL. Skin turgor WNL  Sensorium: Diminished  Semmes Weinstein monofilament test. Normal tactile sensation bilaterally. Nail Exam: Pt has thick disfigured discolored nails with subungual debris noted bilateral entire nail hallux through fifth toenails Ulcer Exam: There is no evidence of ulcer or pre-ulcerative changes or infection. Orthopedic Exam: Muscle tone and strength are WNL. No limitations in general ROM. No crepitus or effusions noted. Foot type and digits show no abnormalities. Bony prominences are unremarkable. Skin: No Porokeratosis. No infection or ulcers  Diagnosis:  Onychomycosis, , Pain in right toe, pain in left toes  Treatment & Plan Procedures and Treatment: Consent by patient was obtained for treatment procedures. The patient understood the discussion of treatment and procedures well. All questions were answered thoroughly reviewed. Debridement of mycotic and hypertrophic toenails, 1 through 5 bilateral and clearing of subungual debris. No ulceration, no infection noted.  Return Visit-Office Procedure: Patient instructed to return to the office for a follow up visit 10  weeks  for continued evaluation and treatment.    Helane GuntherGregory Seth Friedlander DPM

## 2017-07-13 ENCOUNTER — Encounter: Payer: Self-pay | Admitting: Podiatry

## 2017-07-13 ENCOUNTER — Ambulatory Visit (INDEPENDENT_AMBULATORY_CARE_PROVIDER_SITE_OTHER): Payer: Medicare Other | Admitting: Podiatry

## 2017-07-13 DIAGNOSIS — M79676 Pain in unspecified toe(s): Secondary | ICD-10-CM

## 2017-07-13 DIAGNOSIS — B351 Tinea unguium: Secondary | ICD-10-CM | POA: Diagnosis not present

## 2017-07-13 DIAGNOSIS — E1140 Type 2 diabetes mellitus with diabetic neuropathy, unspecified: Secondary | ICD-10-CM | POA: Diagnosis not present

## 2017-07-13 NOTE — Progress Notes (Signed)
Complaint:  Visit Type: Patient returns to my office for continued preventative foot care services. Complaint: Patient states" my nails have grown long and thick and become painful to walk and wear shoes" Patient has been diagnosed with DM with neuropathy.. The patient presents for preventative foot care services. No changes to ROS  Podiatric Exam: Vascular: dorsalis pedis and posterior tibial pulses are palpable bilateral. Capillary return is immediate. Temperature gradient is WNL. Skin turgor WNL  Sensorium: Diminished  Semmes Weinstein monofilament test. Normal tactile sensation bilaterally. Nail Exam: Pt has thick disfigured discolored nails with subungual debris noted bilateral entire nail hallux through fifth toenails Ulcer Exam: There is no evidence of ulcer or pre-ulcerative changes or infection. Orthopedic Exam: Muscle tone and strength are WNL. No limitations in general ROM. No crepitus or effusions noted. Foot type and digits show no abnormalities. Bony prominences are unremarkable. Skin: No Porokeratosis. No infection or ulcers  Diagnosis:  Onychomycosis, , Pain in right toe, pain in left toes  Treatment & Plan Procedures and Treatment: Consent by patient was obtained for treatment procedures. The patient understood the discussion of treatment and procedures well. All questions were answered thoroughly reviewed. Debridement of mycotic and hypertrophic toenails, 1 through 5 bilateral and clearing of subungual debris. No ulceration, no infection noted.  Return Visit-Office Procedure: Patient instructed to return to the office for a follow up visit 10  weeks  for continued evaluation and treatment.    Helane GuntherGregory [place] State DPM

## 2017-09-21 ENCOUNTER — Ambulatory Visit: Payer: Medicare Other | Admitting: Podiatry

## 2017-09-30 ENCOUNTER — Ambulatory Visit: Payer: Medicare Other | Admitting: Podiatry

## 2017-10-17 DEATH — deceased
